# Patient Record
Sex: Female | Born: 1935 | Race: Black or African American | Hispanic: No | Smoking: Never smoker
Health system: Southern US, Community
[De-identification: ages and names within clinical notes are randomized; demographics above are authoritative.]

## PROBLEM LIST (undated history)

## (undated) DIAGNOSIS — M1990 Unspecified osteoarthritis, unspecified site: Secondary | ICD-10-CM

## (undated) DIAGNOSIS — I10 Essential (primary) hypertension: Secondary | ICD-10-CM

## (undated) DIAGNOSIS — E119 Type 2 diabetes mellitus without complications: Secondary | ICD-10-CM

## (undated) DIAGNOSIS — M51379 Other intervertebral disc degeneration, lumbosacral region without mention of lumbar back pain or lower extremity pain: Secondary | ICD-10-CM

## (undated) DIAGNOSIS — M5137 Other intervertebral disc degeneration, lumbosacral region: Secondary | ICD-10-CM

## (undated) HISTORY — PX: APPENDECTOMY: SHX54

## (undated) HISTORY — DX: Other intervertebral disc degeneration, lumbosacral region without mention of lumbar back pain or lower extremity pain: M51.379

## (undated) HISTORY — PX: HYSTEROTOMY: SHX1776

## (undated) HISTORY — DX: Other intervertebral disc degeneration, lumbosacral region: M51.37

## (undated) HISTORY — DX: Unspecified osteoarthritis, unspecified site: M19.90

---

## 2015-06-21 DIAGNOSIS — N1330 Unspecified hydronephrosis: Secondary | ICD-10-CM | POA: Diagnosis not present

## 2015-06-21 DIAGNOSIS — F329 Major depressive disorder, single episode, unspecified: Secondary | ICD-10-CM | POA: Diagnosis present

## 2015-06-21 DIAGNOSIS — E871 Hypo-osmolality and hyponatremia: Secondary | ICD-10-CM | POA: Diagnosis not present

## 2015-06-21 DIAGNOSIS — E119 Type 2 diabetes mellitus without complications: Secondary | ICD-10-CM | POA: Diagnosis present

## 2015-06-21 DIAGNOSIS — N132 Hydronephrosis with renal and ureteral calculous obstruction: Secondary | ICD-10-CM | POA: Diagnosis present

## 2015-06-21 DIAGNOSIS — I10 Essential (primary) hypertension: Secondary | ICD-10-CM | POA: Diagnosis not present

## 2015-06-21 DIAGNOSIS — M25551 Pain in right hip: Secondary | ICD-10-CM | POA: Diagnosis not present

## 2015-06-21 DIAGNOSIS — R1011 Right upper quadrant pain: Secondary | ICD-10-CM | POA: Diagnosis not present

## 2015-06-21 DIAGNOSIS — E860 Dehydration: Secondary | ICD-10-CM | POA: Diagnosis present

## 2015-07-01 DIAGNOSIS — I10 Essential (primary) hypertension: Secondary | ICD-10-CM | POA: Diagnosis not present

## 2015-07-01 DIAGNOSIS — Z6827 Body mass index (BMI) 27.0-27.9, adult: Secondary | ICD-10-CM | POA: Diagnosis not present

## 2015-07-01 DIAGNOSIS — E119 Type 2 diabetes mellitus without complications: Secondary | ICD-10-CM | POA: Diagnosis not present

## 2015-07-01 DIAGNOSIS — E871 Hypo-osmolality and hyponatremia: Secondary | ICD-10-CM | POA: Diagnosis not present

## 2015-07-04 DIAGNOSIS — R079 Chest pain, unspecified: Secondary | ICD-10-CM | POA: Diagnosis not present

## 2015-07-04 DIAGNOSIS — R0789 Other chest pain: Secondary | ICD-10-CM | POA: Diagnosis not present

## 2015-07-04 DIAGNOSIS — R0981 Nasal congestion: Secondary | ICD-10-CM | POA: Diagnosis not present

## 2015-07-04 DIAGNOSIS — E119 Type 2 diabetes mellitus without complications: Secondary | ICD-10-CM | POA: Diagnosis not present

## 2015-07-04 DIAGNOSIS — I10 Essential (primary) hypertension: Secondary | ICD-10-CM | POA: Diagnosis not present

## 2015-08-02 DIAGNOSIS — I10 Essential (primary) hypertension: Secondary | ICD-10-CM | POA: Diagnosis not present

## 2015-08-02 DIAGNOSIS — R4189 Other symptoms and signs involving cognitive functions and awareness: Secondary | ICD-10-CM | POA: Diagnosis not present

## 2015-08-02 DIAGNOSIS — M150 Primary generalized (osteo)arthritis: Secondary | ICD-10-CM | POA: Diagnosis not present

## 2015-08-02 DIAGNOSIS — E878 Other disorders of electrolyte and fluid balance, not elsewhere classified: Secondary | ICD-10-CM | POA: Diagnosis not present

## 2015-08-02 DIAGNOSIS — E1151 Type 2 diabetes mellitus with diabetic peripheral angiopathy without gangrene: Secondary | ICD-10-CM | POA: Diagnosis not present

## 2015-08-12 DIAGNOSIS — L858 Other specified epidermal thickening: Secondary | ICD-10-CM | POA: Diagnosis not present

## 2015-08-12 DIAGNOSIS — M79674 Pain in right toe(s): Secondary | ICD-10-CM | POA: Diagnosis not present

## 2015-08-12 DIAGNOSIS — M2041 Other hammer toe(s) (acquired), right foot: Secondary | ICD-10-CM | POA: Diagnosis not present

## 2015-08-29 DIAGNOSIS — E1151 Type 2 diabetes mellitus with diabetic peripheral angiopathy without gangrene: Secondary | ICD-10-CM | POA: Diagnosis not present

## 2015-08-29 DIAGNOSIS — R4189 Other symptoms and signs involving cognitive functions and awareness: Secondary | ICD-10-CM | POA: Diagnosis not present

## 2015-08-29 DIAGNOSIS — H04123 Dry eye syndrome of bilateral lacrimal glands: Secondary | ICD-10-CM | POA: Diagnosis not present

## 2015-08-29 DIAGNOSIS — I10 Essential (primary) hypertension: Secondary | ICD-10-CM | POA: Diagnosis not present

## 2015-08-29 DIAGNOSIS — H26491 Other secondary cataract, right eye: Secondary | ICD-10-CM | POA: Diagnosis not present

## 2015-08-29 DIAGNOSIS — E878 Other disorders of electrolyte and fluid balance, not elsewhere classified: Secondary | ICD-10-CM | POA: Diagnosis not present

## 2015-08-29 DIAGNOSIS — M150 Primary generalized (osteo)arthritis: Secondary | ICD-10-CM | POA: Diagnosis not present

## 2015-09-26 DIAGNOSIS — M2041 Other hammer toe(s) (acquired), right foot: Secondary | ICD-10-CM | POA: Diagnosis not present

## 2015-09-26 DIAGNOSIS — M79674 Pain in right toe(s): Secondary | ICD-10-CM | POA: Diagnosis not present

## 2015-10-03 DIAGNOSIS — M2041 Other hammer toe(s) (acquired), right foot: Secondary | ICD-10-CM | POA: Diagnosis not present

## 2015-10-03 DIAGNOSIS — Z01818 Encounter for other preprocedural examination: Secondary | ICD-10-CM | POA: Diagnosis not present

## 2015-10-17 DIAGNOSIS — Z01818 Encounter for other preprocedural examination: Secondary | ICD-10-CM | POA: Diagnosis not present

## 2015-10-17 DIAGNOSIS — M2041 Other hammer toe(s) (acquired), right foot: Secondary | ICD-10-CM | POA: Diagnosis not present

## 2015-10-17 DIAGNOSIS — E878 Other disorders of electrolyte and fluid balance, not elsewhere classified: Secondary | ICD-10-CM | POA: Diagnosis not present

## 2015-10-17 DIAGNOSIS — E1151 Type 2 diabetes mellitus with diabetic peripheral angiopathy without gangrene: Secondary | ICD-10-CM | POA: Diagnosis not present

## 2015-10-17 DIAGNOSIS — R4189 Other symptoms and signs involving cognitive functions and awareness: Secondary | ICD-10-CM | POA: Diagnosis not present

## 2015-10-17 DIAGNOSIS — I10 Essential (primary) hypertension: Secondary | ICD-10-CM | POA: Diagnosis not present

## 2015-10-28 DIAGNOSIS — M2041 Other hammer toe(s) (acquired), right foot: Secondary | ICD-10-CM | POA: Diagnosis not present

## 2015-10-28 DIAGNOSIS — Z79899 Other long term (current) drug therapy: Secondary | ICD-10-CM | POA: Diagnosis not present

## 2015-10-28 DIAGNOSIS — Z7984 Long term (current) use of oral hypoglycemic drugs: Secondary | ICD-10-CM | POA: Diagnosis not present

## 2015-10-28 DIAGNOSIS — Z90710 Acquired absence of both cervix and uterus: Secondary | ICD-10-CM | POA: Diagnosis not present

## 2015-10-28 DIAGNOSIS — Z791 Long term (current) use of non-steroidal anti-inflammatories (NSAID): Secondary | ICD-10-CM | POA: Diagnosis not present

## 2015-10-28 DIAGNOSIS — E119 Type 2 diabetes mellitus without complications: Secondary | ICD-10-CM | POA: Diagnosis not present

## 2015-10-28 DIAGNOSIS — I10 Essential (primary) hypertension: Secondary | ICD-10-CM | POA: Diagnosis not present

## 2015-11-02 DIAGNOSIS — M2041 Other hammer toe(s) (acquired), right foot: Secondary | ICD-10-CM | POA: Diagnosis not present

## 2015-11-09 DIAGNOSIS — M2041 Other hammer toe(s) (acquired), right foot: Secondary | ICD-10-CM | POA: Diagnosis not present

## 2015-11-09 DIAGNOSIS — M76821 Posterior tibial tendinitis, right leg: Secondary | ICD-10-CM | POA: Diagnosis not present

## 2015-12-19 DIAGNOSIS — I10 Essential (primary) hypertension: Secondary | ICD-10-CM | POA: Diagnosis not present

## 2015-12-19 DIAGNOSIS — E1151 Type 2 diabetes mellitus with diabetic peripheral angiopathy without gangrene: Secondary | ICD-10-CM | POA: Diagnosis not present

## 2015-12-19 DIAGNOSIS — R4189 Other symptoms and signs involving cognitive functions and awareness: Secondary | ICD-10-CM | POA: Diagnosis not present

## 2015-12-19 DIAGNOSIS — M25571 Pain in right ankle and joints of right foot: Secondary | ICD-10-CM | POA: Diagnosis not present

## 2015-12-19 DIAGNOSIS — E878 Other disorders of electrolyte and fluid balance, not elsewhere classified: Secondary | ICD-10-CM | POA: Diagnosis not present

## 2015-12-19 DIAGNOSIS — M150 Primary generalized (osteo)arthritis: Secondary | ICD-10-CM | POA: Diagnosis not present

## 2016-01-02 DIAGNOSIS — M79671 Pain in right foot: Secondary | ICD-10-CM | POA: Diagnosis not present

## 2016-01-02 DIAGNOSIS — M65871 Other synovitis and tenosynovitis, right ankle and foot: Secondary | ICD-10-CM | POA: Diagnosis not present

## 2016-01-02 DIAGNOSIS — M76821 Posterior tibial tendinitis, right leg: Secondary | ICD-10-CM | POA: Diagnosis not present

## 2016-01-30 DIAGNOSIS — M7661 Achilles tendinitis, right leg: Secondary | ICD-10-CM | POA: Diagnosis not present

## 2016-02-01 DIAGNOSIS — M76821 Posterior tibial tendinitis, right leg: Secondary | ICD-10-CM | POA: Diagnosis not present

## 2016-02-25 DIAGNOSIS — I10 Essential (primary) hypertension: Secondary | ICD-10-CM | POA: Diagnosis not present

## 2016-02-25 DIAGNOSIS — E119 Type 2 diabetes mellitus without complications: Secondary | ICD-10-CM | POA: Diagnosis not present

## 2016-02-25 DIAGNOSIS — R42 Dizziness and giddiness: Secondary | ICD-10-CM | POA: Diagnosis not present

## 2016-02-25 DIAGNOSIS — Z7984 Long term (current) use of oral hypoglycemic drugs: Secondary | ICD-10-CM | POA: Diagnosis not present

## 2016-02-25 DIAGNOSIS — X58XXXA Exposure to other specified factors, initial encounter: Secondary | ICD-10-CM | POA: Diagnosis not present

## 2016-02-25 DIAGNOSIS — Y929 Unspecified place or not applicable: Secondary | ICD-10-CM | POA: Diagnosis not present

## 2016-02-25 DIAGNOSIS — S29011A Strain of muscle and tendon of front wall of thorax, initial encounter: Secondary | ICD-10-CM | POA: Diagnosis not present

## 2016-02-25 DIAGNOSIS — Y93K1 Activity, walking an animal: Secondary | ICD-10-CM | POA: Diagnosis not present

## 2016-03-24 DIAGNOSIS — R4182 Altered mental status, unspecified: Secondary | ICD-10-CM | POA: Diagnosis not present

## 2016-03-24 DIAGNOSIS — Z79899 Other long term (current) drug therapy: Secondary | ICD-10-CM | POA: Diagnosis not present

## 2016-03-24 DIAGNOSIS — G9341 Metabolic encephalopathy: Secondary | ICD-10-CM | POA: Diagnosis not present

## 2016-03-24 DIAGNOSIS — N390 Urinary tract infection, site not specified: Secondary | ICD-10-CM | POA: Diagnosis not present

## 2016-03-24 DIAGNOSIS — Z23 Encounter for immunization: Secondary | ICD-10-CM | POA: Diagnosis not present

## 2016-03-24 DIAGNOSIS — R079 Chest pain, unspecified: Secondary | ICD-10-CM | POA: Diagnosis not present

## 2016-03-24 DIAGNOSIS — I10 Essential (primary) hypertension: Secondary | ICD-10-CM | POA: Diagnosis not present

## 2016-03-24 DIAGNOSIS — N281 Cyst of kidney, acquired: Secondary | ICD-10-CM | POA: Diagnosis not present

## 2016-03-24 DIAGNOSIS — B961 Klebsiella pneumoniae [K. pneumoniae] as the cause of diseases classified elsewhere: Secondary | ICD-10-CM | POA: Diagnosis not present

## 2016-03-24 DIAGNOSIS — E861 Hypovolemia: Secondary | ICD-10-CM | POA: Diagnosis not present

## 2016-03-24 DIAGNOSIS — N3000 Acute cystitis without hematuria: Secondary | ICD-10-CM | POA: Diagnosis not present

## 2016-03-24 DIAGNOSIS — R1110 Vomiting, unspecified: Secondary | ICD-10-CM | POA: Diagnosis not present

## 2016-03-24 DIAGNOSIS — Z7984 Long term (current) use of oral hypoglycemic drugs: Secondary | ICD-10-CM | POA: Diagnosis not present

## 2016-03-24 DIAGNOSIS — R911 Solitary pulmonary nodule: Secondary | ICD-10-CM | POA: Diagnosis not present

## 2016-03-24 DIAGNOSIS — I214 Non-ST elevation (NSTEMI) myocardial infarction: Secondary | ICD-10-CM | POA: Diagnosis not present

## 2016-03-24 DIAGNOSIS — E871 Hypo-osmolality and hyponatremia: Secondary | ICD-10-CM | POA: Diagnosis not present

## 2016-03-24 DIAGNOSIS — R112 Nausea with vomiting, unspecified: Secondary | ICD-10-CM | POA: Diagnosis not present

## 2016-03-24 DIAGNOSIS — E119 Type 2 diabetes mellitus without complications: Secondary | ICD-10-CM | POA: Diagnosis not present

## 2016-03-24 DIAGNOSIS — R631 Polydipsia: Secondary | ICD-10-CM | POA: Diagnosis present

## 2016-03-24 LAB — CBC AND DIFFERENTIAL
HCT: 38 (ref 36–46)
HEMOGLOBIN: 13.8 (ref 12.0–16.0)
PLATELETS: 199 (ref 150–399)
WBC: 4.7

## 2016-03-24 LAB — PROTIME-INR: PROTIME: 13.7 (ref 10.0–13.8)

## 2016-03-24 LAB — BASIC METABOLIC PANEL
BUN: 10 (ref 4–21)
CREATININE: 1 (ref 0.5–1.1)
Glucose: 88
Potassium: 4.3 (ref 3.4–5.3)
SODIUM: 114 — AB (ref 137–147)

## 2016-03-24 LAB — POCT INR: INR: 1 (ref 0.9–1.1)

## 2016-03-24 LAB — TSH: TSH: 0.78 (ref 0.41–5.90)

## 2016-03-24 LAB — HEPATIC FUNCTION PANEL
ALK PHOS: 61 (ref 25–125)
ALT: 39 — AB (ref 7–35)
AST: 32 (ref 13–35)

## 2016-03-26 LAB — BASIC METABOLIC PANEL
BUN: 9 (ref 4–21)
Creatinine: 1 (ref 0.5–1.1)
GLUCOSE: 115
POTASSIUM: 4.4 (ref 3.4–5.3)
Sodium: 131 — AB (ref 137–147)

## 2016-03-26 LAB — CBC AND DIFFERENTIAL
HEMATOCRIT: 33 — AB (ref 36–46)
Hemoglobin: 11.8 — AB (ref 12.0–16.0)
PLATELETS: 222 (ref 150–399)
WBC: 5.1

## 2016-03-27 LAB — BASIC METABOLIC PANEL
BUN: 10 (ref 4–21)
CREATININE: 1.1 (ref 0.5–1.1)
GLUCOSE: 103
POTASSIUM: 5 (ref 3.4–5.3)
Sodium: 132 — AB (ref 137–147)

## 2016-03-29 DIAGNOSIS — E119 Type 2 diabetes mellitus without complications: Secondary | ICD-10-CM | POA: Diagnosis not present

## 2016-03-29 DIAGNOSIS — F0390 Unspecified dementia without behavioral disturbance: Secondary | ICD-10-CM | POA: Diagnosis not present

## 2016-03-29 DIAGNOSIS — E871 Hypo-osmolality and hyponatremia: Secondary | ICD-10-CM | POA: Diagnosis not present

## 2016-03-29 DIAGNOSIS — N390 Urinary tract infection, site not specified: Secondary | ICD-10-CM | POA: Diagnosis not present

## 2016-03-29 DIAGNOSIS — I10 Essential (primary) hypertension: Secondary | ICD-10-CM | POA: Diagnosis not present

## 2016-04-02 DIAGNOSIS — E119 Type 2 diabetes mellitus without complications: Secondary | ICD-10-CM | POA: Diagnosis not present

## 2016-04-02 DIAGNOSIS — I10 Essential (primary) hypertension: Secondary | ICD-10-CM | POA: Diagnosis not present

## 2016-04-02 DIAGNOSIS — F0390 Unspecified dementia without behavioral disturbance: Secondary | ICD-10-CM | POA: Diagnosis not present

## 2016-04-02 DIAGNOSIS — E871 Hypo-osmolality and hyponatremia: Secondary | ICD-10-CM | POA: Diagnosis not present

## 2016-04-02 DIAGNOSIS — N390 Urinary tract infection, site not specified: Secondary | ICD-10-CM | POA: Diagnosis not present

## 2016-04-04 DIAGNOSIS — I10 Essential (primary) hypertension: Secondary | ICD-10-CM | POA: Diagnosis not present

## 2016-04-04 DIAGNOSIS — E871 Hypo-osmolality and hyponatremia: Secondary | ICD-10-CM | POA: Diagnosis not present

## 2016-04-04 DIAGNOSIS — F0390 Unspecified dementia without behavioral disturbance: Secondary | ICD-10-CM | POA: Diagnosis not present

## 2016-04-04 DIAGNOSIS — N390 Urinary tract infection, site not specified: Secondary | ICD-10-CM | POA: Diagnosis not present

## 2016-04-04 DIAGNOSIS — E119 Type 2 diabetes mellitus without complications: Secondary | ICD-10-CM | POA: Diagnosis not present

## 2016-04-06 DIAGNOSIS — E119 Type 2 diabetes mellitus without complications: Secondary | ICD-10-CM | POA: Diagnosis not present

## 2016-04-06 DIAGNOSIS — N390 Urinary tract infection, site not specified: Secondary | ICD-10-CM | POA: Diagnosis not present

## 2016-04-06 DIAGNOSIS — E871 Hypo-osmolality and hyponatremia: Secondary | ICD-10-CM | POA: Diagnosis not present

## 2016-04-06 DIAGNOSIS — I10 Essential (primary) hypertension: Secondary | ICD-10-CM | POA: Diagnosis not present

## 2016-04-06 DIAGNOSIS — F0390 Unspecified dementia without behavioral disturbance: Secondary | ICD-10-CM | POA: Diagnosis not present

## 2016-04-19 DIAGNOSIS — E119 Type 2 diabetes mellitus without complications: Secondary | ICD-10-CM | POA: Diagnosis not present

## 2016-04-19 DIAGNOSIS — E871 Hypo-osmolality and hyponatremia: Secondary | ICD-10-CM | POA: Diagnosis not present

## 2016-04-19 DIAGNOSIS — N390 Urinary tract infection, site not specified: Secondary | ICD-10-CM | POA: Diagnosis not present

## 2016-04-19 DIAGNOSIS — I10 Essential (primary) hypertension: Secondary | ICD-10-CM | POA: Diagnosis not present

## 2016-04-19 DIAGNOSIS — F0390 Unspecified dementia without behavioral disturbance: Secondary | ICD-10-CM | POA: Diagnosis not present

## 2016-07-26 ENCOUNTER — Ambulatory Visit (HOSPITAL_COMMUNITY)
Admission: EM | Admit: 2016-07-26 | Discharge: 2016-07-26 | Disposition: A | Discharge status: Home / Self Care | Payer: Medicare Other | Attending: Emergency Medicine | Admitting: Emergency Medicine

## 2016-07-26 ENCOUNTER — Encounter (HOSPITAL_COMMUNITY): Payer: Self-pay | Admitting: Emergency Medicine

## 2016-07-26 DIAGNOSIS — Z760 Encounter for issue of repeat prescription: Secondary | ICD-10-CM

## 2016-07-26 DIAGNOSIS — M25511 Pain in right shoulder: Secondary | ICD-10-CM | POA: Diagnosis not present

## 2016-07-26 DIAGNOSIS — G8929 Other chronic pain: Secondary | ICD-10-CM | POA: Diagnosis not present

## 2016-07-26 HISTORY — DX: Type 2 diabetes mellitus without complications: E11.9

## 2016-07-26 HISTORY — DX: Essential (primary) hypertension: I10

## 2016-07-26 MED ORDER — GLIPIZIDE 5 MG PO TABS: 2.5000 mg | ORAL_TABLET | Freq: Every day | ORAL | 0 refills | Status: DC

## 2016-07-26 MED ORDER — LISINOPRIL-HYDROCHLOROTHIAZIDE 20-25 MG PO TABS: 1.0000 | ORAL_TABLET | Freq: Every day | ORAL | 0 refills | Status: DC

## 2016-07-26 MED ORDER — MELOXICAM 7.5 MG PO TABS: 7.5000 mg | ORAL_TABLET | Freq: Every day | ORAL | 0 refills | Status: DC

## 2016-07-26 MED ORDER — TRIAMCINOLONE ACETONIDE 40 MG/ML IJ SUSP
INTRAMUSCULAR | Status: AC
  Filled 2016-07-26: qty 1

## 2016-07-26 NOTE — ED Triage Notes (Signed)
Here for medication refill on DM and BP meds  Reports she ran out 1 week ago... Does no have PCP  Voices no other concerns.   A&O x4... NAD

## 2016-07-26 NOTE — ED Provider Notes (Signed)
CSN: [id number]     Arrival date & time 07/26/16  1321 History   First MD Initiated Contact with Patient 07/26/16 1506     Chief Complaint  Patient presents with  . Medication Refill   (Consider location/radiation/quality/duration/timing/severity/associated sxs/prior Treatment) 83 year old female presents with right shoulder pain for 1 months, unable to left her arm and comb her hair due to pain, has had previous injection of the joint and requests treatment. No fever, redness, swelling or systemic signs of symptoms of infection. She also requests refill of her medications. States she has moved to the area from [place] Has not established with a PCP here.   The history is provided by the patient.    Past Medical History:  Diagnosis Date  . Diabetes mellitus without complication (HCC)   . Hypertension    History reviewed. No pertinent surgical history. History reviewed. No pertinent family history. Social History  Substance Use Topics  . Smoking status: Never Smoker  . Smokeless tobacco: Never Used  . Alcohol use No   OB History    No data available     Review of Systems  Reason unable to perform ROS: as covered in HPI.  All other systems reviewed and are negative.   Allergies  Patient has no known allergies.  Home Medications   Prior to Admission medications   Medication Sig Start Date End Date Taking? Authorizing Provider  glipiZIDE (GLUCOTROL) 5 MG tablet Take 0.5 tablets (2.5 mg total) by mouth daily before breakfast. 07/26/16   Dorena BodoLawrence Babbette Dalesandro, NP  lisinopril-hydrochlorothiazide (PRINZIDE,ZESTORETIC) 20-25 MG tablet Take 1 tablet by mouth daily. 07/26/16   Dorena BodoLawrence Rasheka Denard, NP  meloxicam (MOBIC) 7.5 MG tablet Take 1 tablet (7.5 mg total) by mouth daily. 07/26/16   Dorena BodoLawrence Elfa Wooton, NP   Meds Ordered and Administered this Visit  Medications - No data to display  BP (!) 120/46 (BP Location: Right Arm)   Pulse 65   Temp 98 F (36.7 C) (Oral)   Resp 18   SpO2 100%  No  data found.   Physical Exam  Constitutional: She is oriented to person, place, and time. She appears well-developed and well-nourished. No distress.  HENT:  Head: Normocephalic and atraumatic.  Right Ear: External ear normal.  Left Ear: External ear normal.  Mouth/Throat: Oropharynx is clear and moist.  Cardiovascular: Normal rate, regular rhythm and normal heart sounds.   Pulmonary/Chest: Effort normal and breath sounds normal.  Abdominal: Soft. Bowel sounds are normal.  Musculoskeletal:       Right shoulder: She exhibits decreased range of motion, tenderness and pain. She exhibits no swelling, no effusion, no crepitus, no deformity and no laceration.  Right shoulder pain with internal rotation, external rotation, and flexion. Pain does not radiate.  Neurological: She is alert and oriented to person, place, and time.  Skin: Skin is warm and dry. Capillary refill takes less than 2 seconds. She is not diaphoretic.  Psychiatric: She has a normal mood and affect.    Urgent Care Course     .Joint Aspiration/Arthrocentesis Date/Time: 07/26/2016 3:32 PM Performed by: Dorena BodoKENNARD, Angelic Schnelle Authorized by: Domenick GongMORTENSON, ASHLEY   Consent:    Consent obtained:  Verbal   Consent given by:  Patient   Risks discussed:  Bleeding, infection, nerve damage and pain   Alternatives discussed:  No treatment, delayed treatment and alternative treatment Location:    Location:  Shoulder   Shoulder:  R glenohumeral Anesthesia (see MAR for exact dosages):    Anesthesia method:  Local infiltration   Local anesthetic:  Bupivacaine 0.5% w/o epi Procedure details:    Approach:  Anterior   Aspirate amount:  0   Aspirate characteristics:  Clear   Steroid injected: yes     Specimen collected: no   Post-procedure details:    Dressing:  Adhesive bandage   Patient tolerance of procedure:  Tolerated well, no immediate complications Comments:     Experienced immediate relief of symptoms    (including critical  care time)  Labs Review Labs Reviewed - No data to display  Imaging Review No results found.   Visual Acuity Review  Right Eye Distance:   Left Eye Distance:   Bilateral Distance:    Right Eye Near:   Left Eye Near:    Bilateral Near:         MDM   1. Chronic right shoulder pain   2. Medication refill     You have received a joint injection in your right shoulder today of Kenalog and Bupivacaine for pain. I have refilled your glipizide, lisinopril-HCTZ, and Mobic. I recommend you establish with a primary care provider who can follow up and manage you medications. Should your symptoms worsen follow up with a primary care provider or return to clinic.     Dorena Bodo, NP 07/26/16 1537

## 2016-07-26 NOTE — Discharge Instructions (Signed)
You have received a joint injection in your right shoulder today of Kenalog and Bupivacaine for pain. I have refilled your glipizide, lisinopril-HCTZ, and Mobic. I recommend you establish with a primary care provider who can follow up and manage you medications. Should your symptoms worsen follow up with a primary care provider or return to clinic.

## 2016-09-07 DIAGNOSIS — I10 Essential (primary) hypertension: Secondary | ICD-10-CM | POA: Diagnosis not present

## 2016-09-07 DIAGNOSIS — E119 Type 2 diabetes mellitus without complications: Secondary | ICD-10-CM | POA: Diagnosis not present

## 2016-09-07 DIAGNOSIS — M2550 Pain in unspecified joint: Secondary | ICD-10-CM | POA: Diagnosis not present

## 2016-10-26 DIAGNOSIS — M2550 Pain in unspecified joint: Secondary | ICD-10-CM | POA: Diagnosis not present

## 2016-10-26 DIAGNOSIS — E119 Type 2 diabetes mellitus without complications: Secondary | ICD-10-CM | POA: Diagnosis not present

## 2016-10-26 DIAGNOSIS — I10 Essential (primary) hypertension: Secondary | ICD-10-CM | POA: Diagnosis not present

## 2016-10-26 DIAGNOSIS — Z0000 Encounter for general adult medical examination without abnormal findings: Secondary | ICD-10-CM | POA: Diagnosis not present

## 2016-10-26 LAB — BASIC METABOLIC PANEL
BUN: 31 — AB (ref 4–21)
CREATININE: 1.3 — AB (ref 0.5–1.1)
Glucose: 106
Potassium: 4.5 (ref 3.4–5.3)
Sodium: 142 (ref 137–147)

## 2016-10-26 LAB — HEPATIC FUNCTION PANEL
ALK PHOS: 65 (ref 25–125)
ALT: 27 (ref 7–35)
AST: 26 (ref 13–35)
Bilirubin, Total: 0.7

## 2016-10-26 LAB — LIPID PANEL
CHOLESTEROL: 181 (ref 0–200)
HDL: 76 — AB (ref 35–70)
LDL Cholesterol: 85
TRIGLYCERIDES: 100 (ref 40–160)

## 2016-10-26 LAB — HEMOGLOBIN A1C: HEMOGLOBIN A1C: 6.3

## 2016-11-23 ENCOUNTER — Inpatient Hospital Stay (HOSPITAL_COMMUNITY): Payer: Medicare Other

## 2016-11-23 ENCOUNTER — Encounter (HOSPITAL_COMMUNITY): Payer: Self-pay | Admitting: Emergency Medicine

## 2016-11-23 ENCOUNTER — Emergency Department (HOSPITAL_COMMUNITY): Payer: Medicare Other

## 2016-11-23 ENCOUNTER — Inpatient Hospital Stay (HOSPITAL_COMMUNITY)
Admission: EM | Admit: 2016-11-23 | Discharge: 2016-11-28 | DRG: 640 | Disposition: A | Discharge status: Home Health | Payer: Medicare Other | Attending: Internal Medicine | Admitting: Internal Medicine

## 2016-11-23 DIAGNOSIS — I10 Essential (primary) hypertension: Secondary | ICD-10-CM | POA: Diagnosis not present

## 2016-11-23 DIAGNOSIS — E119 Type 2 diabetes mellitus without complications: Secondary | ICD-10-CM | POA: Diagnosis present

## 2016-11-23 DIAGNOSIS — E871 Hypo-osmolality and hyponatremia: Secondary | ICD-10-CM | POA: Diagnosis present

## 2016-11-23 DIAGNOSIS — E861 Hypovolemia: Secondary | ICD-10-CM | POA: Diagnosis present

## 2016-11-23 DIAGNOSIS — Y92009 Unspecified place in unspecified non-institutional (private) residence as the place of occurrence of the external cause: Secondary | ICD-10-CM | POA: Diagnosis not present

## 2016-11-23 DIAGNOSIS — T502X5A Adverse effect of carbonic-anhydrase inhibitors, benzothiadiazides and other diuretics, initial encounter: Secondary | ICD-10-CM | POA: Diagnosis present

## 2016-11-23 DIAGNOSIS — D72819 Decreased white blood cell count, unspecified: Secondary | ICD-10-CM | POA: Diagnosis present

## 2016-11-23 DIAGNOSIS — E876 Hypokalemia: Secondary | ICD-10-CM | POA: Diagnosis not present

## 2016-11-23 DIAGNOSIS — Z7984 Long term (current) use of oral hypoglycemic drugs: Secondary | ICD-10-CM

## 2016-11-23 DIAGNOSIS — E118 Type 2 diabetes mellitus with unspecified complications: Secondary | ICD-10-CM | POA: Diagnosis not present

## 2016-11-23 DIAGNOSIS — H9190 Unspecified hearing loss, unspecified ear: Secondary | ICD-10-CM | POA: Diagnosis present

## 2016-11-23 DIAGNOSIS — E860 Dehydration: Secondary | ICD-10-CM | POA: Diagnosis present

## 2016-11-23 DIAGNOSIS — R4182 Altered mental status, unspecified: Secondary | ICD-10-CM | POA: Diagnosis not present

## 2016-11-23 DIAGNOSIS — R296 Repeated falls: Secondary | ICD-10-CM | POA: Diagnosis present

## 2016-11-23 DIAGNOSIS — Z79899 Other long term (current) drug therapy: Secondary | ICD-10-CM

## 2016-11-23 DIAGNOSIS — K5900 Constipation, unspecified: Secondary | ICD-10-CM | POA: Diagnosis present

## 2016-11-23 DIAGNOSIS — R531 Weakness: Secondary | ICD-10-CM | POA: Diagnosis not present

## 2016-11-23 DIAGNOSIS — G9341 Metabolic encephalopathy: Secondary | ICD-10-CM | POA: Diagnosis present

## 2016-11-23 DIAGNOSIS — I639 Cerebral infarction, unspecified: Secondary | ICD-10-CM

## 2016-11-23 LAB — COMPREHENSIVE METABOLIC PANEL
ALBUMIN: 3.9 g/dL (ref 3.5–5.0)
ALK PHOS: 49 U/L (ref 38–126)
ALT: 21 U/L (ref 14–54)
AST: 33 U/L (ref 15–41)
Anion gap: 9 (ref 5–15)
BILIRUBIN TOTAL: 1.5 mg/dL — AB (ref 0.3–1.2)
BUN: 7 mg/dL (ref 6–20)
CALCIUM: 9 mg/dL (ref 8.9–10.3)
CO2: 23 mmol/L (ref 22–32)
CREATININE: 0.91 mg/dL (ref 0.44–1.00)
Chloride: 77 mmol/L — ABNORMAL LOW (ref 101–111)
GFR calc non Af Amer: 55 mL/min — ABNORMAL LOW (ref >60)
GLUCOSE: 134 mg/dL — AB (ref 65–99)
Potassium: 3.2 mmol/L — ABNORMAL LOW (ref 3.5–5.1)
SODIUM: 109 mmol/L — AB (ref 135–145)
Total Protein: 6.5 g/dL (ref 6.5–8.1)

## 2016-11-23 LAB — BASIC METABOLIC PANEL
Anion gap: 11 (ref 5–15)
Anion gap: 8 (ref 5–15)
BUN: 7 mg/dL (ref 6–20)
BUN: 6 mg/dL (ref 6–20)
CALCIUM: 9 mg/dL (ref 8.9–10.3)
CALCIUM: 9.3 mg/dL (ref 8.9–10.3)
CHLORIDE: 83 mmol/L — AB (ref 101–111)
CO2: 23 mmol/L (ref 22–32)
CO2: 27 mmol/L (ref 22–32)
CREATININE: 0.88 mg/dL (ref 0.44–1.00)
Chloride: 80 mmol/L — ABNORMAL LOW (ref 101–111)
Creatinine, Ser: 0.86 mg/dL (ref 0.44–1.00)
GFR calc non Af Amer: 59 mL/min — ABNORMAL LOW (ref >60)
GFR calc non Af Amer: 57 mL/min — ABNORMAL LOW (ref >60)
Glucose, Bld: 97 mg/dL (ref 65–99)
Glucose, Bld: 86 mg/dL (ref 65–99)
POTASSIUM: 3.3 mmol/L — AB (ref 3.5–5.1)
Potassium: 3.2 mmol/L — ABNORMAL LOW (ref 3.5–5.1)
SODIUM: 114 mmol/L — AB (ref 135–145)
SODIUM: 118 mmol/L — AB (ref 135–145)

## 2016-11-23 LAB — URINALYSIS, ROUTINE W REFLEX MICROSCOPIC
Bilirubin Urine: NEGATIVE
GLUCOSE, UA: NEGATIVE mg/dL
HGB URINE DIPSTICK: NEGATIVE
Ketones, ur: NEGATIVE mg/dL
Nitrite: NEGATIVE
PH: 7 (ref 5.0–8.0)
Protein, ur: NEGATIVE mg/dL
SPECIFIC GRAVITY, URINE: 1.001 — AB (ref 1.005–1.030)

## 2016-11-23 LAB — OSMOLALITY, URINE: Osmolality, Ur: 59 mOsm/kg — ABNORMAL LOW (ref 300–900)

## 2016-11-23 LAB — CBC
HCT: 34.6 % — ABNORMAL LOW (ref 36.0–46.0)
Hemoglobin: 12.6 g/dL (ref 12.0–15.0)
MCH: 28.1 pg (ref 26.0–34.0)
MCHC: 36.4 g/dL — AB (ref 30.0–36.0)
MCV: 77.2 fL — ABNORMAL LOW (ref 78.0–100.0)
PLATELETS: 236 10*3/uL (ref 150–400)
RBC: 4.48 MIL/uL (ref 3.87–5.11)
RDW: 12.5 % (ref 11.5–15.5)
WBC: 2.6 10*3/uL — ABNORMAL LOW (ref 4.0–10.5)

## 2016-11-23 LAB — OSMOLALITY: OSMOLALITY: 239 mosm/kg — AB (ref 275–295)

## 2016-11-23 LAB — POCT I-STAT TROPONIN I: TROPONIN I, POC: 0 ng/mL (ref 0.00–0.08)

## 2016-11-23 LAB — TSH: TSH: 1.033 u[IU]/mL (ref 0.350–4.500)

## 2016-11-23 LAB — SODIUM, URINE, RANDOM: Sodium, Ur: <10 mmol/L

## 2016-11-23 LAB — LIPASE, BLOOD: Lipase: 41 U/L (ref 11–51)

## 2016-11-23 LAB — CBG MONITORING, ED: Glucose-Capillary: 149 mg/dL — ABNORMAL HIGH (ref 65–99)

## 2016-11-23 LAB — GLUCOSE, CAPILLARY: GLUCOSE-CAPILLARY: 71 mg/dL (ref 65–99)

## 2016-11-23 MED ORDER — POTASSIUM CHLORIDE CRYS ER 20 MEQ PO TBCR
40.0000 meq | EXTENDED_RELEASE_TABLET | Freq: Once | ORAL | Status: AC
  Administered 2016-11-23: 40 meq via ORAL
  Filled 2016-11-23: qty 2

## 2016-11-23 MED ORDER — ONDANSETRON HCL 4 MG PO TABS: 4.0000 mg | ORAL_TABLET | Freq: Four times a day (QID) | ORAL | Status: DC | PRN

## 2016-11-23 MED ORDER — SODIUM CHLORIDE 0.9 % IV SOLN
INTRAVENOUS | Status: DC
  Administered 2016-11-23 – 2016-11-26 (×3): via INTRAVENOUS

## 2016-11-23 MED ORDER — ACETAMINOPHEN 650 MG RE SUPP: 650.0000 mg | Freq: Four times a day (QID) | RECTAL | Status: DC | PRN

## 2016-11-23 MED ORDER — ONDANSETRON HCL 4 MG/2ML IJ SOLN: 4.0000 mg | Freq: Four times a day (QID) | INTRAMUSCULAR | Status: DC | PRN

## 2016-11-23 MED ORDER — ACETAMINOPHEN 325 MG PO TABS
650.0000 mg | ORAL_TABLET | Freq: Four times a day (QID) | ORAL | Status: DC | PRN
  Administered 2016-11-24: 650 mg via ORAL
  Filled 2016-11-23: qty 2

## 2016-11-23 MED ORDER — SODIUM CHLORIDE 0.9 % IV SOLN
INTRAVENOUS | Status: DC
  Administered 2016-11-23: 21:00:00 via INTRAVENOUS

## 2016-11-23 MED ORDER — INSULIN ASPART 100 UNIT/ML ~~LOC~~ SOLN: 0.0000 [IU] | Freq: Three times a day (TID) | SUBCUTANEOUS | Status: DC

## 2016-11-23 MED ORDER — INSULIN ASPART 100 UNIT/ML ~~LOC~~ SOLN
0.0000 [IU] | Freq: Every day | SUBCUTANEOUS | Status: DC
Start: 2016-11-23 — End: 2016-11-25

## 2016-11-23 MED ORDER — LORAZEPAM 2 MG/ML IJ SOLN: 1.0000 mg | INTRAMUSCULAR | Status: DC | PRN

## 2016-11-23 MED ORDER — SODIUM CHLORIDE 3 % IV SOLN
INTRAVENOUS | Status: DC
  Administered 2016-11-23: 50 mL/h via INTRAVENOUS
  Filled 2016-11-23 (×3): qty 500

## 2016-11-23 MED ORDER — ENOXAPARIN SODIUM 40 MG/0.4ML ~~LOC~~ SOLN
40.0000 mg | SUBCUTANEOUS | Status: DC
  Administered 2016-11-23 – 2016-11-24 (×2): 40 mg via SUBCUTANEOUS
  Filled 2016-11-23 (×2): qty 0.4

## 2016-11-23 NOTE — Consult Note (Signed)
PULMONARY / CRITICAL CARE MEDICINE   Name: Monica Cohen MRN: [id number] DOB: [birth year]    ADMISSION DATE:  11/23/2016 CONSULTATION DATE:  11/23/2016  REFERRING MD:  Dr. Isidoro Donning  CHIEF COMPLAINT:  Hyponatremia/ Confusion  HISTORY OF PRESENT ILLNESS:  Patient is encephalopathic and therefore HPI obtained from medical chart review.  No family/ friends in room.  83 year old female with PMH of HTN, hard of hearing, and DM type 2 who presented to the ER for complaints confusion.  Patient lives alone independently and apparently lives a very active lifestyle, walking her dogs daily.  She recently moved to [place] area over the last several months.  She is a very poor historian and hard of hearing (wearing a right hearing aid).  She reports she takes her blood pressure and diabetes medications regularly but is not able to recall what they are.  Reportedly, her neighbor had not seen her over the last week and went to check on her.  She was found confused, weak, and holding on to things, so they brought her to the ER.  Patients reports some recent vomiting over the last week or so and questionable falls at home.   ED workup notable for Na 109, K 3.2, WBC 2.6, Hgb 12.6, glucose 134, UA negative, LFTs normal, urine osmolarity 59, urine sodium < 10, heart rate 82, blood pressure 119/53, resp rate 19, 99% on room air and afebrile at 97.7.  Patient was started on hypertonic saline in the ER.  TRH to admit patient, however given that patient is on 3% saline and requires ICU admission, PCCM consulted.     Of noted, on review of medical chart in care everywhere, patient had routine office visit on 10/26/2016 at [facility] with Na 142.  At that time she was taking mobic, lisinopril- HCTZ 20/12.5mg  daily, and glipizide 2.5mg  daily.   PAST MEDICAL HISTORY :  She  has a past medical history of Diabetes mellitus without complication (HCC) and Hypertension.  PAST SURGICAL HISTORY: She  has no past surgical history  on file.  No Known Allergies  No current facility-administered medications on file prior to encounter.    Current Outpatient Prescriptions on File Prior to Encounter  Medication Sig  . glipiZIDE (GLUCOTROL) 5 MG tablet Take 0.5 tablets (2.5 mg total) by mouth daily before breakfast.  . lisinopril-hydrochlorothiazide (PRINZIDE,ZESTORETIC) 20-25 MG tablet Take 1 tablet by mouth daily.  . meloxicam (MOBIC) 7.5 MG tablet Take 1 tablet (7.5 mg total) by mouth daily. (Patient not taking: Reported on 11/23/2016)    FAMILY HISTORY:  Her has no family status information on file.    SOCIAL HISTORY: She  reports that she has never smoked. She has never used smokeless tobacco. She reports that she does not drink alcohol.  REVIEW OF SYSTEMS:  Unable to assess  SUBJECTIVE:  Patient denies any SOB, pain, thirst.    VITAL SIGNS: BP (!) 118/91   Pulse 84   Temp 98.5 F (36.9 C) (Rectal)   Resp 15   SpO2 100%   HEMODYNAMICS:    VENTILATOR SETTINGS:    INTAKE / OUTPUT: No intake/output data recorded.  PHYSICAL EXAMINATION: General:  Elderly female lying in bed in NAD  HEENT: MM pink/moist, PERRL, no JVD Neuro: Alert to person, place, month but also confused at other times, MAE, generalized weakness  CV: s1s2 rrr, no m/r/g PULM: even/non-labored, lungs bilaterally clear  GI: soft, non-tender, bsx4 active  Extremities: warm/dry, no edema  Skin: no rashes, lesions,  or bruising noted   LABS:  BMET  Recent Labs Lab 11/23/16 1359  NA 109*  K 3.2*  CL 77*  CO2 23  BUN 7  CREATININE 0.91  GLUCOSE 134*    Electrolytes  Recent Labs Lab 11/23/16 1359  CALCIUM 9.0    CBC  Recent Labs Lab 11/23/16 1359  WBC 2.6*  HGB 12.6  HCT 34.6*  PLT 236    Coag's No results for input(s): APTT, INR in the last 168 hours.  Sepsis Markers No results for input(s): LATICACIDVEN, PROCALCITON, O2SATVEN in the last 168 hours.  ABG No results for input(s): PHART, PCO2ART,  PO2ART in the last 168 hours.  Liver Enzymes  Recent Labs Lab 11/23/16 1359  AST 33  ALT 21  ALKPHOS 49  BILITOT 1.5*  ALBUMIN 3.9    Cardiac Enzymes No results for input(s): TROPONINI, PROBNP in the last 168 hours.  Glucose  Recent Labs Lab 11/23/16 1401  GLUCAP 149*    Imaging Ct Head Wo Contrast  Result Date: 11/23/2016 CLINICAL DATA:  Increased weakness for 5 months with multiple falls EXAM: CT HEAD WITHOUT CONTRAST TECHNIQUE: Contiguous axial images were obtained from the base of the skull through the vertex without intravenous contrast. COMPARISON:  None. FINDINGS: Brain: No acute territorial infarction, hemorrhage or intracranial mass is seen. Mild periventricular white matter hypodensity likely small vessel ischemic change. Moderate atrophy. Normal ventricle size. Empty sella. Vascular: No hyperdense vessels.  Carotid artery calcifications. Skull: Normal. Negative for fracture or focal lesion. Sinuses/Orbits: Mild mucosal thickening in the ethmoid sinuses. No acute orbital abnormality. Bilateral lens extraction. Other: None IMPRESSION: 1. No CT evidence for acute intracranial abnormality. 2. Small vessel ischemic changes of the white matter and atrophy. Electronically Signed   By: Jasmine PangKim  Fujinaga M.D.   On: 11/23/2016 18:08   Dg Chest Port 1 View  Result Date: 11/23/2016 CLINICAL DATA:  83 year old female with weakness, back pain and hypertension EXAM: PORTABLE CHEST 1 VIEW COMPARISON:  None. FINDINGS: Cardiac and mediastinal contours are within normal limits. Atherosclerotic calcifications are present in the transverse aorta. No focal airspace consolidation, pleural effusion, pneumothorax or pulmonary edema. Calcifications project over the mitral annulus. The osseous structures are intact and unremarkable. IMPRESSION: 1. No acute cardiopulmonary process. 2.  Aortic Atherosclerosis (ICD10-170.0) 3. Calcifications of the mitral valve annulus. Electronically Signed   By: Malachy MoanHeath   McCullough M.D.   On: 11/23/2016 16:11     STUDIES:  6/8 CT head > negative 6/8 CXR > no acute cardiopulmonary process, aortic atherosclerosis, calcifications of MV annulus  CULTURES: none  ANTIBIOTICS: None   SIGNIFICANT EVENTS: 6/8 Admit with severe hyponatremia/ confusion  LINES/TUBES: PIV   DISCUSSION: 83 y/o woman with severe hyponatremia, mixed etiology - hypovolemia, low solute intake, also likely exacerbated by thiazide. Rapid correction with 3% suggests SIADH not playing major role.  ASSESSMENT / PLAN:  PULMONARY A: No acute issues CXR neg P:   Pulmonary hygiene   CARDIOVASCULAR A:  Hx HTN P:  Routine monitoring.  RENAL A:   Hypo-osmolar hyponatremia (Na 142 on 5/11) P:   Mixed etiology - hypovolemia, low solute intake, also likely exacerbated by thiazide. Rapid correction with 3% suggests SIADH not playing major role. NPO for now, fluids to [facility]. Will monitor and allow Na to equilibrate. Then allow PO intake and may give more fluids.  GASTROINTESTINAL A:   No acute issues   P:   HEMATOLOGIC A:   Leukopenia - afebrile, no s/s of infection  P:  SCDs Trend CBC   INFECTIOUS A:   No acute issues   P:   Monitor fever curve Trend WBC  ENDOCRINE A:   DM type 2    P:   Trend glucose Check TSH  NEUROLOGIC A:   Acute encephalopathy Head CT neg P:   Will correct Na as above.  FAMILY  - Updates: No family or friends at bedside.   - Inter-disciplinary family meet or Palliative Care meeting due by:  6/15  STAFF NOTE: I have personally seen and evaluated the patient and reviewed the available data. I have discussed the patient with the housestaff officer or NP. I agree with the resident's note as documented above.  Briefly: This is an 83 y/o woman who lives independently who was found confused with extreme hypo-osm hypoNa. Likely mixed etiology with low solute intake and hypovolemia as major drivers. Unclear what may have been initial  trigger.  The patient is critically ill with multiple organ systems failure and requires high complexity decision making for assessment and support, frequent evaluation and titration of therapies, application of advanced monitoring technologies and extensive interpretation of multiple databases. Critical Care Time devoted to patient care services described in this note is 35 minutes.  Jamie Kato, MD Pulmonary & Critical Care Medicine November 23, 2016, 10:37 PM  Pulmonary and Critical Care Medicine [facility] Pager: [phone]  11/23/2016, 6:29 PM

## 2016-11-23 NOTE — ED Notes (Signed)
Pt recently moved here 2 months ago -- went to neighbor's apt today, told her she was weak, unable to walk independently, neighbor states pt normally has energy, but has been feeling "sick" past week--

## 2016-11-23 NOTE — ED Notes (Signed)
Sodium -- 109 -- lab called critical value.

## 2016-11-23 NOTE — H&P (Addendum)
History and Physical        Hospital Admission Note Date: 11/23/2016  Patient name: Monica Cohen Medical record number: [id number] Date of birth: [birth year] Age: 83 y.o. Gender: female  PCP: Patient, No Pcp Per    Patient coming from: Home  I have reviewed all records in the [facility].    Chief Complaint:  Confused  HPI: Patient is a 83 year old female with history of diabetes mellitus, hypertension who apparently has moved to this area 2 months ago was brought to [facility] ED by her neighbor for acute mental status changes today. Patient is confused and only oriented to herself and knows that she is in the hospital, otherwise she is not able to provide any history. Per neighbor, patient is very active, walks her dog 3-4 times daily, talkative and upbeat person at baseline. She had not seen the patient for a week hence she does not know what happened in the last 1 week. Today, patient came over to her neighbor's house and was holding onto the things and repeating things over, looked very weak and not herself. Per the neighbor, she brought the patient to the hospital. Per the neighbor, patient's son lives in [place] however he is a dialysis patient and the patient has been living alone independently. Patient is confused and unable to state what medications she is on. She did say that she was not feeling well and had few episodes of vomiting this week.  ED work-up/course:  Temp 97.7, respiratory rate 19, heart rate 82, BP 119/53, O2 sats 99% on room air BMET showed sodium 109, unknown baseline (patient recently moved to [place], she is unable to state from where she moved from due to her mental status), his potassium 3.2, LFTs normal, WBC 2.6, hemoglobin 12.6, glucose 134, UA negative for UTI Urine osmolarity 59, urine sodium less than 10  Review of Systems: Positives  marked in 'bold' Unable to obtain from the patient due to her mental status  Past Medical History: Past Medical History:  Diagnosis Date  . Diabetes mellitus without complication (HCC)   . Hypertension     History reviewed. No pertinent surgical history.  Medications: Prior to Admission medications   Medication Sig Start Date End Date Taking? Authorizing Provider  aspirin 325 MG tablet Take 325 mg by mouth daily.   Yes [provider]  glipiZIDE (GLUCOTROL) 5 MG tablet Take 0.5 tablets (2.5 mg total) by mouth daily before breakfast. 07/26/16  Yes Dorena Bodo, NP  lisinopril-hydrochlorothiazide (PRINZIDE,ZESTORETIC) 20-25 MG tablet Take 1 tablet by mouth daily. 07/26/16  Yes Dorena Bodo, NP  meloxicam (MOBIC) 7.5 MG tablet Take 1 tablet (7.5 mg total) by mouth daily. Patient not taking: Reported on 11/23/2016 07/26/16   Dorena Bodo, NP    Allergies:  No Known Allergies  Social History: Prior records reports that she has never smoked. She has never used smokeless tobacco. She reports that she does not drink alcohol. Her drug history is not on file.  Family History: Unable to assess any family history due to her mental status  Physical Exam: Blood pressure (!) 119/53, pulse 77, temperature 98.5 F (36.9 C), temperature source Rectal, resp.  rate 19, SpO2 99 %. General: Alert, awake, somewhat confused, oriented x2 self, she is in the hospital, in no acute distress. Eyes: pink conjunctiva,anicteric sclera, pupils equal and reactive to light and accomodation, HEENT: normocephalic, atraumatic, oropharynx clear, no tongue biting Neck: supple, no masses or lymphadenopathy, no goiter, no bruits, no JVD CVS: Regular rate and rhythm, without murmurs, rubs or gallops. No lower extremity edema Resp : Clear to auscultation bilaterally, no wheezing, rales or rhonchi. GI : Soft, nontender, nondistended, positive bowel sounds, no masses. No hepatomegaly. No hernia.    Musculoskeletal: No clubbing or cyanosis, positive pedal pulses. No contracture. ROM intact , no injuries Neuro:  strength 5/5 upper and lower extremities bilaterally, able to follow commands after a few attempts Psych: confused Skin: no rashes or lesions, warm and dry   LABS on Admission: I have personally reviewed all the labs and imagings below    Basic Metabolic Panel:  Recent Labs Lab 11/23/16 1359  NA 109*  K 3.2*  CL 77*  CO2 23  GLUCOSE 134*  BUN 7  CREATININE 0.91  CALCIUM 9.0   Liver Function Tests:  Recent Labs Lab 11/23/16 1359  AST 33  ALT 21  ALKPHOS 49  BILITOT 1.5*  PROT 6.5  ALBUMIN 3.9    Recent Labs Lab 11/23/16 1359  LIPASE 41   No results for input(s): AMMONIA in the last 168 hours. CBC:  Recent Labs Lab 11/23/16 1359  WBC 2.6*  HGB 12.6  HCT 34.6*  MCV 77.2*  PLT 236   Cardiac Enzymes: No results for input(s): CKTOTAL, CKMB, CKMBINDEX, TROPONINI in the last 168 hours. BNP: Invalid input(s): POCBNP CBG:  Recent Labs Lab 11/23/16 1401  GLUCAP 149*    Radiological Exams on Admission:  Dg Chest Port 1 View  Result Date: 11/23/2016 CLINICAL DATA:  83 year old female with weakness, back pain and hypertension EXAM: PORTABLE CHEST 1 VIEW COMPARISON:  None. FINDINGS: Cardiac and mediastinal contours are within normal limits. Atherosclerotic calcifications are present in the transverse aorta. No focal airspace consolidation, pleural effusion, pneumothorax or pulmonary edema. Calcifications project over the mitral annulus. The osseous structures are intact and unremarkable. IMPRESSION: 1. No acute cardiopulmonary process. 2.  Aortic Atherosclerosis (ICD10-170.0) 3. Calcifications of the mitral valve annulus. Electronically Signed   By: Malachy MoanHeath  McCullough M.D.   On: 11/23/2016 16:11      EKG: Independently reviewed.Rate 82, normal sinus rhythm, no acute ST-T wave changes   Assessment/Plan Principal Problem:   Hyponatremia acute,  severe hypo-osmolar hyponatremia  - Unknown baseline, calculated serum osmolarity  232, urine osmolarity 59, urine sodium less than 10 - Obtain TSH, Serum osmolarity  - Unclear if patient was on lisinopril, HCTZ, meloxicam, however will hold all of the above medications - Patient was started on hypertonic saline in the ED at 50 mL an hour, I have cut it down to 25 mL/ hour, obtain stat BMET to assess sodium level. Goal for less than <225meq rise in Na over next 4 hours and safely. I have also discussed with CCM (Dr Dellie CatholicSommers) for consult, for now admit to stepdown. May need to be in ICU after CCM evaluation. - Check weight of the patient and titrate the hypertonic saline down or change to 0.9% normal saline, pending next BMET level - Seizure precautions   Active Problems:   Acute metabolic encephalopathy: Likely due to #1, however rule out CVA  - Obtain stat CT head without contrast  - Place on seizure precautions, follow BMET and  above management as #1     Essential hypertension - BP currently stable, hold off on lisinopril and HCTZ   - patient will not be on HCTZ at discharge     Diabetes mellitus (HCC) - Obtain hemoglobin A1c  - Place on sliding scale insulin     Hypokalemia - Replaced  DVT prophylaxis:  Lovenox   CODE STATUS:  currently on full CODE STATUS, no family members at the bedside   Consults called:  critical care medicine, discussed with Dr. Dellie Catholic   Family Communication: Admission, patients condition and plan of care including tests being ordered have been discussed with the patient's neighbor at the bedside  who indicates understanding and agree with the plan and Code Status  Admission status:  inpatient stepdown   Disposition plan: Further plan will depend as patient's clinical course evolves and further radiologic and laboratory data become available.    At the time of admission, it appears that the appropriate admission status for this patient is INPATIENT . This  is judged to be reasonable and necessary in order to provide the required intensity of service to ensure the patient's safety given the presenting symptoms of severe hyponatremia, acute metabolic encephalopathy, physical exam findings, and initial radiographic and laboratory data in the context of their chronic comorbidities.   Time Spent  Critical care time spent : 65 minutes examining the patient, discussing with EDP, critical care physician, coordinating care and management.The medical decision making on this patient was of high complexity, the critically ill patient is at high risk for clinical deterioration, therefore this is a level 3 visit.     Thad Ranger M.D. [facility] 11/23/2016, 5:57 PM Pager: [phone]  If 7PM-7AM, please contact night-coverage [email or web address] Password [email or web address]

## 2016-11-23 NOTE — ED Triage Notes (Signed)
Pt brought in today by neighbor ; pt sts back pain and N/V

## 2016-11-23 NOTE — ED Provider Notes (Signed)
[facility]-EMERGENCY DEPT Provider Note   CSN: [id number] Arrival date & time: 11/23/16  1354     History   Chief Complaint Chief Complaint  Patient presents with  . Emesis    HPI Monica Cohen is a 83 y.o. female.  HPI  Level V caveat is and is unable to remember or give clear history. 83 year old female who is fairly new to this area moving within the past 5 months presents today stating that she has been weak and has had multiple falls. Her neighbor is present who states that until recently the patient has been walking her dog 3-4 times a day and very active. She has noted over the past week that she has not seen her as active as usual today. Today she told her neighbor that she was weak and had fallen. No injury was noted. She is brought into the emergency department prior to my evaluation labs were obtained which revealed a sodium of 109. The neighbor who is present states that they have attempted to contact her son who lives here in [place]. Patient has been living independently here. It is unclear if the son is aware that she is here at this time. The patient states that she is not taking any medications although a note from [facility] urgent care in February noted that she was taking glipizide and lisinopril-hydrochlorothiazide, as well as meloxicam  Past Medical History:  Diagnosis Date  . Diabetes mellitus without complication (HCC)   . Hypertension     There are no active problems to display for this patient.   History reviewed. No pertinent surgical history.  OB History    No data available       Home Medications    Prior to Admission medications   Medication Sig Start Date End Date Taking? Authorizing Provider  glipiZIDE (GLUCOTROL) 5 MG tablet Take 0.5 tablets (2.5 mg total) by mouth daily before breakfast. 07/26/16   Dorena Bodo, NP  lisinopril-hydrochlorothiazide (PRINZIDE,ZESTORETIC) 20-25 MG tablet Take 1 tablet by mouth daily. 07/26/16   Dorena Bodo,  NP  meloxicam (MOBIC) 7.5 MG tablet Take 1 tablet (7.5 mg total) by mouth daily. 07/26/16   Dorena Bodo, NP    Family History History reviewed. No pertinent family history.  Social History Social History  Substance Use Topics  . Smoking status: Never Smoker  . Smokeless tobacco: Never Used  . Alcohol use No     Allergies   Patient has no known allergies.   Review of Systems Review of Systems  Endocrine: Positive for polydipsia.  All other systems reviewed and are negative.    Physical Exam Updated Vital Signs BP (!) 153/74   Pulse 80   Temp 97.7 F (36.5 C) (Oral)   Resp 16   SpO2 99%   Physical Exam  Constitutional: She appears well-developed and well-nourished.  HENT:  Head: Normocephalic.  Right Ear: External ear normal.  Left Ear: External ear normal.  Mouth/Throat: Oropharynx is clear and moist.  Eyes: EOM are normal. Pupils are equal, round, and reactive to light.  Neck: Normal range of motion. Neck supple.  Cardiovascular: Normal rate, regular rhythm and normal heart sounds.   Pulmonary/Chest: Effort normal and breath sounds normal.  Abdominal: Soft. Bowel sounds are normal.  Musculoskeletal: Normal range of motion.  Neurological: She is alert.  Patient oriented to person, hospital , but not specific hospital, not able to give month or year  Skin: Skin is warm and dry. Capillary refill takes less than 2  seconds.  Psychiatric: She has a normal mood and affect.  Vitals reviewed.    ED Treatments / Results  Labs (all labs ordered are listed, but only abnormal results are displayed) Labs Reviewed  COMPREHENSIVE METABOLIC PANEL - Abnormal; Notable for the following:       Result Value   Sodium 109 (*)    Potassium 3.2 (*)    Chloride 77 (*)    Glucose, Bld 134 (*)    Total Bilirubin 1.5 (*)    GFR calc non Af Amer 55 (*)    All other components within normal limits  CBC - Abnormal; Notable for the following:    WBC 2.6 (*)    HCT 34.6 (*)     MCV 77.2 (*)    MCHC 36.4 (*)    All other components within normal limits  CBG MONITORING, ED - Abnormal; Notable for the following:    Glucose-Capillary 149 (*)    All other components within normal limits  LIPASE, BLOOD  URINALYSIS, ROUTINE W REFLEX MICROSCOPIC  SODIUM  SODIUM  SODIUM  OSMOLALITY  OSMOLALITY, URINE  SODIUM, URINE, RANDOM    EKG  EKG Interpretation  Date/Time:  Friday November 23 2016 15:26:39 EDT Ventricular Rate:  82 PR Interval:    QRS Duration: 90 QT Interval:  363 QTC Calculation: 424 R Axis:   68 Text Interpretation:  Sinus rhythm Consider right atrial enlargement Non-specific ST-t changes Confirmed by Kalley Nicholl MD, Duwayne HeckANIELLE [phone]([id number]) on 11/23/2016 3:32:41 PM       Radiology Dg Chest Port 1 View  Result Date: 11/23/2016 CLINICAL DATA:  83 year old female with weakness, back pain and hypertension EXAM: PORTABLE CHEST 1 VIEW COMPARISON:  None. FINDINGS: Cardiac and mediastinal contours are within normal limits. Atherosclerotic calcifications are present in the transverse aorta. No focal airspace consolidation, pleural effusion, pneumothorax or pulmonary edema. Calcifications project over the mitral annulus. The osseous structures are intact and unremarkable. IMPRESSION: 1. No acute cardiopulmonary process. 2.  Aortic Atherosclerosis (ICD10-170.0) 3. Calcifications of the mitral valve annulus. Electronically Signed   By: Malachy MoanHeath  McCullough M.D.   On: 11/23/2016 16:11    Procedures Procedures (including critical care time)  Medications Ordered in ED Medications  sodium chloride (hypertonic) 3 % solution (not administered)     Initial Impression / Assessment and Plan / ED Course  I have reviewed the triage vital signs and the nursing notes.  Pertinent labs & imaging results that were available during my care of the patient were reviewed by me and considered in my medical decision making (see chart for details).     Patient presents with weakness and found  to be profoundly hyponatremic.  No evidence of seizures here.  Patient is new to area and no old record found except one urgent care visit where patient had meds refilled and shoulder pain.  Today she reports taking no medications but increased water intake.  Patient started on hyponatremia order set with hypertonic saline at slower rate of 50/hour as this likely acute on chronic.  The only additional historian is a neighbor who reports patient has lived here 5 months and she has noted her less active for one week.   Discussed with Dr. Isidoro Donningai and plan admission.  Final Clinical Impressions(s) / ED Diagnoses   Final diagnoses:  Hyponatremia  Altered mental status, unspecified altered mental status type    New Prescriptions New Prescriptions   No medications on file     Margarita Grizzleay, Shela Esses, MD 11/23/16 1657

## 2016-11-23 NOTE — ED Notes (Signed)
Admitting at the bedside, will collect labs after they leave.

## 2016-11-23 NOTE — ED Notes (Signed)
Placed Pt on Purewick external catheter.

## 2016-11-23 NOTE — Progress Notes (Signed)
--  eLink Physician-Brief Progress Note Patient Name: Monica AverSusan Fulwider DOB: [birth year] MRN: [id number]   Date of Service  11/23/2016  HPI/Events of Note  Na+ = 109 --> 114.   eICU Interventions  Will order: 1. D/C 3% NaCl. 2. 0.9 NaCl to run IV at 75 mL/hour.  3. Continue to monitor NaCl Q 4 hours.      Intervention Category Major Interventions: Electrolyte abnormality - evaluation and management  Sommer,Steven Eugene 11/23/2016, 9:02 PM

## 2016-11-23 NOTE — Progress Notes (Signed)
eLink Physician-Brief Progress Note Patient Name: Monica Cohen DOB: 1926/06/15 MRN: [id number]   Date of Service  11/23/2016  HPI/Events of Note  Patient admitted with hyponatremia and confusion.  She was placed on 3% hypertonic saline with rapid correction of Na from 109 to 118.  Labs suggest a hypovolemic hyponatremia a but patient also on a thiazide diuretic.  This has been stopped.  Urine Na less than 1 and Urine osm of less than 50 suggests hypovolemic hyponatremia but BP is  Stable.  eICU Interventions  For now will reduce the NS 3% had been d/ced earlier Continue with NPO status thorough the night Consider TSH and cortisol labs     Intervention Category Major Interventions: Electrolyte abnormality - evaluation and management  DETERDING,ELIZABETH 11/23/2016, 9:55 PM

## 2016-11-23 NOTE — Progress Notes (Signed)
CRITICAL VALUE ALERT  Critical Value:  Osmolality 236  Date & Time Notied:  11/23/16 7:43 PM   Provider Notified: Toniann FailKakrakandy  Orders Received/Actions taken:

## 2016-11-24 ENCOUNTER — Encounter (HOSPITAL_COMMUNITY): Payer: Self-pay

## 2016-11-24 DIAGNOSIS — E871 Hypo-osmolality and hyponatremia: Principal | ICD-10-CM

## 2016-11-24 DIAGNOSIS — G9341 Metabolic encephalopathy: Secondary | ICD-10-CM

## 2016-11-24 LAB — BASIC METABOLIC PANEL
ANION GAP: 7 (ref 5–15)
ANION GAP: 8 (ref 5–15)
Anion gap: 10 (ref 5–15)
Anion gap: 9 (ref 5–15)
BUN: 6 mg/dL (ref 6–20)
BUN: 6 mg/dL (ref 6–20)
BUN: 5 mg/dL — AB (ref 6–20)
BUN: 6 mg/dL (ref 6–20)
CALCIUM: 9.2 mg/dL (ref 8.9–10.3)
CHLORIDE: 94 mmol/L — AB (ref 101–111)
CO2: 22 mmol/L (ref 22–32)
CO2: 24 mmol/L (ref 22–32)
CO2: 24 mmol/L (ref 22–32)
CO2: 24 mmol/L (ref 22–32)
CREATININE: 0.79 mg/dL (ref 0.44–1.00)
Calcium: 9.4 mg/dL (ref 8.9–10.3)
Calcium: 9.4 mg/dL (ref 8.9–10.3)
Calcium: 9.3 mg/dL (ref 8.9–10.3)
Chloride: 88 mmol/L — ABNORMAL LOW (ref 101–111)
Chloride: 89 mmol/L — ABNORMAL LOW (ref 101–111)
Chloride: 94 mmol/L — ABNORMAL LOW (ref 101–111)
Creatinine, Ser: 0.93 mg/dL (ref 0.44–1.00)
Creatinine, Ser: 0.92 mg/dL (ref 0.44–1.00)
Creatinine, Ser: 0.96 mg/dL (ref 0.44–1.00)
GFR calc Af Amer: >60 mL/min (ref >60)
GFR calc Af Amer: 59 mL/min — ABNORMAL LOW (ref >60)
GFR calc non Af Amer: >60 mL/min (ref >60)
GFR calc non Af Amer: 53 mL/min — ABNORMAL LOW (ref >60)
GFR, EST NON AFRICAN AMERICAN: 54 mL/min — AB (ref >60)
GFR, EST NON AFRICAN AMERICAN: 51 mL/min — AB (ref >60)
GLUCOSE: 113 mg/dL — AB (ref 65–99)
GLUCOSE: 139 mg/dL — AB (ref 65–99)
Glucose, Bld: 77 mg/dL (ref 65–99)
Glucose, Bld: 84 mg/dL (ref 65–99)
POTASSIUM: 4.2 mmol/L (ref 3.5–5.1)
POTASSIUM: 3.8 mmol/L (ref 3.5–5.1)
Potassium: 3.8 mmol/L (ref 3.5–5.1)
Potassium: 3.6 mmol/L (ref 3.5–5.1)
SODIUM: 122 mmol/L — AB (ref 135–145)
Sodium: 122 mmol/L — ABNORMAL LOW (ref 135–145)
Sodium: 125 mmol/L — ABNORMAL LOW (ref 135–145)
Sodium: 124 mmol/L — ABNORMAL LOW (ref 135–145)

## 2016-11-24 LAB — CBC
HEMATOCRIT: 36 % (ref 36.0–46.0)
Hemoglobin: 13.1 g/dL (ref 12.0–15.0)
MCH: 28.1 pg (ref 26.0–34.0)
MCHC: 36.4 g/dL — AB (ref 30.0–36.0)
MCV: 77.3 fL — ABNORMAL LOW (ref 78.0–100.0)
PLATELETS: 212 10*3/uL (ref 150–400)
RBC: 4.66 MIL/uL (ref 3.87–5.11)
RDW: 12.4 % (ref 11.5–15.5)
WBC: 3.4 10*3/uL — ABNORMAL LOW (ref 4.0–10.5)

## 2016-11-24 LAB — SODIUM: SODIUM: 122 mmol/L — AB (ref 135–145)

## 2016-11-24 LAB — MRSA PCR SCREENING: MRSA by PCR: NEGATIVE

## 2016-11-24 LAB — GLUCOSE, CAPILLARY
GLUCOSE-CAPILLARY: 83 mg/dL (ref 65–99)
GLUCOSE-CAPILLARY: 84 mg/dL (ref 65–99)
Glucose-Capillary: 93 mg/dL (ref 65–99)
Glucose-Capillary: 85 mg/dL (ref 65–99)

## 2016-11-24 NOTE — Progress Notes (Signed)
Monica Cohen is A X 3 today. She required update on situation leading to hospital admission. She was concerned about her money and was reassured og what was placed in safe. RN gave her a copy of receipt. Monica Cohen feels that $10 and an empty battery case for her hearing aids are missing. She recalls asking the RN to leave her with $10 so she could get her neighbor to buy batteries. I went through her pocket book with her and found 2 X 5 dollar bills and 2 battery cases. She is still upset and I told her I would share this with Production designer, theatre/television/filmmanager.

## 2016-11-24 NOTE — Progress Notes (Signed)
PULMONARY / CRITICAL CARE MEDICINE   Name: Monica Cohen MRN: [id number] DOB: [birth year]    ADMISSION DATE:  11/23/2016 CONSULTATION DATE:  11/23/2016  REFERRING MD:  Dr. Isidoro Donning  CHIEF COMPLAINT:  Hyponatremia/ Confusion  BRIEF SUMMARY:  83 year old female with PMH of HTN, hard of hearing, and DM type 2 who presented to the ER for complaints confusion.  Patient lives alone independently and apparently lives a very active lifestyle, walking her dogs daily.  She recently moved to [place] area over the last several months.  She is a very poor historian and hard of hearing (wearing a right hearing aid).  She reports she takes her blood pressure and diabetes medications regularly but is not able to recall what they are.  Reportedly, her neighbor had not seen her over the last week and went to check on her.  She was found confused, weak, and holding on to things, so they brought her to the ER.  Patients reports some recent vomiting over the last week or so and questionable falls at home.   ED workup notable for Na 109, K 3.2, WBC 2.6, Hgb 12.6, glucose 134, UA negative, LFTs normal, urine osmolarity 59, urine sodium < 10, heart rate 82, blood pressure 119/53, resp rate 19, 99% on room air and afebrile at 97.7.  Patient was started on hypertonic saline in the ER.  TRH to admit patient, however given that patient is on 3% saline and requires ICU admission, PCCM consulted.     Of note, on review of medical chart in care everywhere, patient had routine office visit on 10/26/2016 at [facility] with Na 142.  At that time she was taking mobic, lisinopril- HCTZ 20/12.5mg  daily, and glipizide 2.5mg  daily.    SUBJECTIVE:  Pt denies acute complaints.  Reports she needs to use the bedpan.  VITAL SIGNS: BP (!) 123/49   Pulse 70   Temp 97.4 F (36.3 C) (Oral)   Resp 14   Ht 4\' 11"  (1.499 m)   Wt 126 lb 15.8 oz (57.6 kg)   SpO2 97%   BMI 25.65 kg/m   HEMODYNAMICS:    VENTILATOR SETTINGS: FiO2 (%):   [0 %] 0 %  INTAKE / OUTPUT: I/O last 3 completed shifts: In: 177.8 [I.V.:177.8] Out: 700 [Urine:700]  PHYSICAL EXAMINATION: General: elderly female in NAD, appears younger than stated age   HEENT: MM pink/moist Neuro: awake, alert, oriented, speech clear, MAE CV: s1s2 rrr, no m/r/g PULM: even/non-labored, lungs bilaterally clear  NG:EXBM, non-tender, bsx4 active  Extremities: warm/dry, no edema  Skin: no rashes or lesions   LABS:  BMET  Recent Labs Lab 11/23/16 2055 11/24/16 0037 11/24/16 0305  NA 118* 122*  122* 122*  K 3.2* 3.8 3.6  CL 83* 88* 89*  CO2 27 24 24   BUN 6 6 6   CREATININE 0.88 0.79 0.93  GLUCOSE 86 77 84    Electrolytes  Recent Labs Lab 11/23/16 2055 11/24/16 0037 11/24/16 0305  CALCIUM 9.3 9.2 9.4    CBC  Recent Labs Lab 11/23/16 1359 11/24/16 0305  WBC 2.6* 3.4*  HGB 12.6 13.1  HCT 34.6* 36.0  PLT 236 212    Coag's No results for input(s): APTT, INR in the last 168 hours.  Sepsis Markers No results for input(s): LATICACIDVEN, PROCALCITON, O2SATVEN in the last 168 hours.  ABG No results for input(s): PHART, PCO2ART, PO2ART in the last 168 hours.  Liver Enzymes  Recent Labs Lab 11/23/16 1359  AST 33  ALT 21  ALKPHOS 49  BILITOT 1.5*  ALBUMIN 3.9    Cardiac Enzymes No results for input(s): TROPONINI, PROBNP in the last 168 hours.  Glucose  Recent Labs Lab 11/23/16 1401 11/23/16 2230  GLUCAP 149* 71    Imaging Ct Head Wo Contrast  Result Date: 11/23/2016 CLINICAL DATA:  Increased weakness for 5 months with multiple falls EXAM: CT HEAD WITHOUT CONTRAST TECHNIQUE: Contiguous axial images were obtained from the base of the skull through the vertex without intravenous contrast. COMPARISON:  None. FINDINGS: Brain: No acute territorial infarction, hemorrhage or intracranial mass is seen. Mild periventricular white matter hypodensity likely small vessel ischemic change. Moderate atrophy. Normal ventricle size. Empty  sella. Vascular: No hyperdense vessels.  Carotid artery calcifications. Skull: Normal. Negative for fracture or focal lesion. Sinuses/Orbits: Mild mucosal thickening in the ethmoid sinuses. No acute orbital abnormality. Bilateral lens extraction. Other: None IMPRESSION: 1. No CT evidence for acute intracranial abnormality. 2. Small vessel ischemic changes of the white matter and atrophy. Electronically Signed   By: Jasmine PangKim  Fujinaga M.D.   On: 11/23/2016 18:08   Dg Chest Port 1 View  Result Date: 11/23/2016 CLINICAL DATA:  83 year old female with weakness, back pain and hypertension EXAM: PORTABLE CHEST 1 VIEW COMPARISON:  None. FINDINGS: Cardiac and mediastinal contours are within normal limits. Atherosclerotic calcifications are present in the transverse aorta. No focal airspace consolidation, pleural effusion, pneumothorax or pulmonary edema. Calcifications project over the mitral annulus. The osseous structures are intact and unremarkable. IMPRESSION: 1. No acute cardiopulmonary process. 2.  Aortic Atherosclerosis (ICD10-170.0) 3. Calcifications of the mitral valve annulus. Electronically Signed   By: Malachy MoanHeath  McCullough M.D.   On: 11/23/2016 16:11     STUDIES:  6/8 CT head > negative 6/8 CXR > no acute cardiopulmonary process, aortic atherosclerosis, calcifications of MV annulus  CULTURES: none  ANTIBIOTICS: None   SIGNIFICANT EVENTS: 6/08  Admit with severe hyponatremia/ confusion 6/09  Improved confusion  LINES/TUBES: PIV   DISCUSSION: 83 y/o woman with severe hyponatremia, mixed etiology - hypovolemia, low solute intake, also likely exacerbated by thiazide. Rapid correction with 3% suggests SIADH not playing major role.    ASSESSMENT / PLAN:  PULMONARY A: No acute issues - CXR negative P:   Pulmonary hygiene   CARDIOVASCULAR A:  Hx HTN P:  Tele monitoring  Transition out of ICU  RENAL A:   Hypo-osmolar hyponatremia (Na 142 on 5/11) - Mixed etiology - hypovolemia, low  solute intake, also likely exacerbated by thiazide, vomiting & poor PO intake. Rapid correction with 3% suggests SIADH not playing major role. P:   KVO IVF's  Diet as tolerated  GASTROINTESTINAL A:   Vomiting - prior to admit  P:  PRN zofran   HEMATOLOGIC A:   Leukopenia - afebrile, no s/s of infection  P:  Trend CBC  SCD's for DVT prophylaxis    ENDOCRINE A:   DM type 2   P:   Per primary   NEUROLOGIC A:   Acute encephalopathy Head CT neg P:   Improving with Na correction  Defer further work up to primary   FAMILY  - Updates: No family at bedside.  Patient updated on plan of care   - Inter-disciplinary family meet or Palliative Care meeting due by:  6/15  PCCM will be available PRN.  Please call back if new needs arise.   Canary BrimBrandi Ollis, NP-C  Pulmonary & Critical Care Pgr: [phone] or if no answer [phone] 11/24/2016, 7:06 AM  Attending Note:  I have examined patient, reviewed labs, studies and notes. I have discussed the case with B Ollis, and I agree with the data and plans as amended above. 83 yo woman w HTN, DM admitted with confusion, found to have severe hyponatremia. She was fairly rapidly corrected, today at 125. On my eval she appears to have tolerated - she is up to chair, interacting normally although she is globally weak. Decreased hearing. Clear lung exam. Would continue slow correction Na, careful with thiazide diuretic especially in setting decreased PO intake. Please call if we can assist in any way.   Levy Pupa, MD, PhD 11/24/2016, 2:52 PM [facility] [phone] or if no answer [phone]

## 2016-11-24 NOTE — Evaluation (Signed)
Clinical/Bedside Swallow Evaluation Patient Details  Name: Monica Cohen MRN: [id number] Date of Birth: Mar 28, 1926  Today's Date: 11/24/2016 Time: SLP Start Time (ACUTE ONLY): 1017 SLP Stop Time (ACUTE ONLY): 1027 SLP Time Calculation (min) (ACUTE ONLY): 10 min  Past Medical History:  Past Medical History:  Diagnosis Date  . Diabetes mellitus without complication (HCC)   . Hypertension    Past Surgical History: History reviewed. No pertinent surgical history. HPI:  83 y/o woman with severe hyponatremia, mixed etiology - hypovolemia, low solute intake, also likely exacerbated by thiazide.CXR negative.    Assessment / Plan / Recommendation Clinical Impression  Patient presents with normal oropharyngeal swallowing function. No further SLP needs indicated.  SLP Visit Diagnosis: Dysphagia, unspecified (R13.10)    Aspiration Risk  No limitations    Diet Recommendation Regular;Thin liquid   Liquid Administration via: Cup;Straw Medication Administration: Whole meds with liquid Supervision: Patient able to self feed Compensations: Slow rate;Small sips/bites Postural Changes: Seated upright at 90 degrees    Other  Recommendations Oral Care Recommendations: Oral care BID   Follow up Recommendations None        Swallow Study   General HPI: 83 y/o woman with severe hyponatremia, mixed etiology - hypovolemia, low solute intake, also likely exacerbated by thiazide.CXR negative.  Type of Study: Bedside Swallow Evaluation Previous Swallow Assessment: none noted Diet Prior to this Study: NPO Temperature Spikes Noted: No Respiratory Status: Room air History of Recent Intubation: No Behavior/Cognition: Alert;Cooperative;Pleasant mood Oral Cavity Assessment: Within Functional Limits Oral Care Completed by SLP: Recent completion by staff Oral Cavity - Dentition: Adequate natural dentition Vision: Functional for self-feeding Self-Feeding Abilities: Able to feed self Patient Positioning:  Upright in bed Baseline Vocal Quality: Low vocal intensity Volitional Cough: Strong Volitional Swallow: Able to elicit    Oral/Motor/Sensory Function Overall Oral Motor/Sensory Function: Within functional limits   Ice Chips Ice chips: Not tested   Thin Liquid Thin Liquid: Within functional limits Presentation: Straw;Self Fed    Nectar Thick Nectar Thick Liquid: Not tested   Honey Thick Honey Thick Liquid: Not tested   Puree Puree: Within functional limits Presentation: Self Fed;Spoon   Solid   GO   Solid: Within functional limits Presentation: Self Fed       Monica Lindquist MA, CCC-SLP [phone]  Monica Cohen 11/24/2016,10:30 AM

## 2016-11-24 NOTE — Progress Notes (Signed)
[facility]                                                                              Patient Demographics  Monica Cohen, is a 83 y.o. female, DOB - 02-28-1926, JYN:[id number]  Admit date - 11/23/2016   Admitting Physician Jaeson Molstad Jenna Luo, MD  Outpatient Primary MD for the patient is Patient, No Pcp Per  Outpatient specialists:   LOS - 1  days   Medical records reviewed and are as summarized below:    Chief Complaint  Patient presents with  . Emesis       Brief summary   Patient is a 83 year old female with diabetes, hypertension, recently moved to [place] 2 months ago presented with acute mental status changes, found to have severe hyponatremia 109 with unknown baseline. Patient was brought by her neighbor, at baseline, physically active person.   Assessment & Plan    Principal Problem: Acute  Hyponatremia with severe hypoosmolar hyponatremia - Serum osm 239, urine osmolarity 59, urine sodium less than 10, rapid correction with 3% suggesting SIADH not the etiology. Patient was started on 3% normal saline in ED, lowered to 25 mL/ hour and change to 0.9% normal saline after Na corrected from 109 to 114 - Currently sodium 122, on 0.9% normal saline at 30 mL/ hour - Patient is alert and oriented x3, no focal neurological deficits, reports that she had episodes of nausea and vomiting with poor appetite in the past week, superimposed with her taking thiazide diuretic likely caused severe dehydration - will obtain stat BMET, diet advanced. TSH 1.0 - Start PT, transfer to stepdown unit   Active Problems:   Acute metabolic encephalopathy: Likely due to severe hyponatremia - CT head showed no acute CVA or intracranial abnormality - Likely due to #1, currently alert and oriented     Essential hypertension - Currently stable, hold off on outpatient antihypertensives, will discontinue thiazide from outpatient antihypertensives     Diabetes mellitus (HCC):  type 2 - Currently on regular diet - CBGs 83, 71, continue sliding scale insulin as needed - Obtain hemoglobin A1c    Hypokalemia - Resolved   Code Status: Full CODE STATUS DVT Prophylaxis:  Lovenox Family Communication: Discussed in detail with the patient, all imaging results, lab results explained to the patient. No family member at the bedside    Disposition Plan:Transfer to stepdown unit  Time Spent in minutes 35 minutes  Procedures:  CT head   Consultants:   Critical care  Antimicrobials :   None    Medications  Scheduled Meds: . enoxaparin (LOVENOX) injection  40 mg Subcutaneous Q24H  . insulin aspart  0-5 Units Subcutaneous QHS  . insulin aspart  0-9 Units Subcutaneous TID WC   Continuous Infusions: . sodium chloride 30 mL/hr at 11/24/16 0700   PRN Meds:.acetaminophen **OR** acetaminophen, LORazepam, ondansetron **OR** ondansetron (ZOFRAN) IV   Antibiotics   Anti-infectives    None        Subjective:   Monica Cohen was seen and examined today.  Feels a lot better today.alert and oriented 3.  Patient denies dizziness, chest pain, shortness of breath, abdominal  pain, N/V/D/C, new weakness, numbess, tingling. No acute events overnight. No fevers.    Objective:   Vitals:   11/24/16 0700 11/24/16 0800 11/24/16 0900 11/24/16 1000  BP: (!) 123/56  (!) 114/56   Pulse: 76 73 62 69  Resp: 17 18 14 14   Temp:      TempSrc:      SpO2: 98% 100% 97% 96%  Weight:      Height:        Intake/Output Summary (Last 24 hours) at 11/24/16 1043 Last data filed at 11/24/16 1000  Gross per 24 hour  Intake           477.83 ml  Output             1000 ml  Net          -522.17 ml     Wt Readings from Last 3 Encounters:  11/23/16 57.6 kg (126 lb 15.8 oz)     Exam  General: Alert and oriented x 3, NAD  Eyes: PERRLA, EOMI, Anicteric Sclera,  HEENT:  Atraumatic, normocephalic, normal oropharynx  Cardiovascular: S1 S2 auscultated, no rubs, murmurs or  gallops. Regular rate and rhythm.  Respiratory: Clear to auscultation bilaterally, no wheezing, rales or rhonchi  Gastrointestinal: Soft, nontender, nondistended, + bowel sounds  Ext: no pedal edema bilaterally  Neuro: AAOx3, Cr N's II- XII. Strength 5/5 upper and lower extremities bilaterally, speech clear, sensations grossly intact  Musculoskeletal: No digital cyanosis, clubbing  Skin: No rashes  Psych: Normal affect and demeanor, alert and oriented x3    Data Reviewed:  I have personally reviewed following labs and imaging studies  Micro Results Recent Results (from the past 240 hour(s))  MRSA PCR Screening     Status: None   Collection Time: 11/24/16  2:51 AM  Result Value Ref Range Status   MRSA by PCR NEGATIVE NEGATIVE Final    Comment:        The GeneXpert MRSA Assay (FDA approved for NASAL specimens only), is one component of a comprehensive MRSA colonization surveillance program. It is not intended to diagnose MRSA infection nor to guide or monitor treatment for MRSA infections.     Radiology Reports Ct Head Wo Contrast  Result Date: 11/23/2016 CLINICAL DATA:  Increased weakness for 5 months with multiple falls EXAM: CT HEAD WITHOUT CONTRAST TECHNIQUE: Contiguous axial images were obtained from the base of the skull through the vertex without intravenous contrast. COMPARISON:  None. FINDINGS: Brain: No acute territorial infarction, hemorrhage or intracranial mass is seen. Mild periventricular white matter hypodensity likely small vessel ischemic change. Moderate atrophy. Normal ventricle size. Empty sella. Vascular: No hyperdense vessels.  Carotid artery calcifications. Skull: Normal. Negative for fracture or focal lesion. Sinuses/Orbits: Mild mucosal thickening in the ethmoid sinuses. No acute orbital abnormality. Bilateral lens extraction. Other: None IMPRESSION: 1. No CT evidence for acute intracranial abnormality. 2. Small vessel ischemic changes of the white  matter and atrophy. Electronically Signed   By: Jasmine PangKim  Fujinaga M.D.   On: 11/23/2016 18:08   Dg Chest Port 1 View  Result Date: 11/23/2016 CLINICAL DATA:  83 year old female with weakness, back pain and hypertension EXAM: PORTABLE CHEST 1 VIEW COMPARISON:  None. FINDINGS: Cardiac and mediastinal contours are within normal limits. Atherosclerotic calcifications are present in the transverse aorta. No focal airspace consolidation, pleural effusion, pneumothorax or pulmonary edema. Calcifications project over the mitral annulus. The osseous structures are intact and unremarkable. IMPRESSION: 1. No acute cardiopulmonary process. 2.  Aortic Atherosclerosis (  ICD10-170.0) 3. Calcifications of the mitral valve annulus. Electronically Signed   By: Malachy Moan M.D.   On: 11/23/2016 16:11    Lab Data:  CBC:  Recent Labs Lab 11/23/16 1359 11/24/16 0305  WBC 2.6* 3.4*  HGB 12.6 13.1  HCT 34.6* 36.0  MCV 77.2* 77.3*  PLT 236 212   Basic Metabolic Panel:  Recent Labs Lab 11/23/16 1359 11/23/16 1834 11/23/16 2055 11/24/16 0037 11/24/16 0305  NA 109* 114* 118* 122*  122* 122*  K 3.2* 3.3* 3.2* 3.8 3.6  CL 77* 80* 83* 88* 89*  CO2 23 23 27 24 24   GLUCOSE 134* 97 86 77 84  BUN 7 7 6 6 6   CREATININE 0.91 0.86 0.88 0.79 0.93  CALCIUM 9.0 9.0 9.3 9.2 9.4   GFR: Estimated Creatinine Clearance: 32.3 mL/min (by C-G formula based on SCr of 0.93 mg/dL). Liver Function Tests:  Recent Labs Lab 11/23/16 1359  AST 33  ALT 21  ALKPHOS 49  BILITOT 1.5*  PROT 6.5  ALBUMIN 3.9    Recent Labs Lab 11/23/16 1359  LIPASE 41   No results for input(s): AMMONIA in the last 168 hours. Coagulation Profile: No results for input(s): INR, PROTIME in the last 168 hours. Cardiac Enzymes: No results for input(s): CKTOTAL, CKMB, CKMBINDEX, TROPONINI in the last 168 hours. BNP (last 3 results) No results for input(s): PROBNP in the last 8760 hours. HbA1C: No results for input(s): HGBA1C in the  last 72 hours. CBG:  Recent Labs Lab 11/23/16 1401 11/23/16 2230 11/24/16 0735  GLUCAP 149* 71 83   Lipid Profile: No results for input(s): CHOL, HDL, LDLCALC, TRIG, CHOLHDL, LDLDIRECT in the last 72 hours. Thyroid Function Tests:  Recent Labs  11/23/16 2055  TSH 1.033   Anemia Panel: No results for input(s): VITAMINB12, FOLATE, FERRITIN, TIBC, IRON, RETICCTPCT in the last 72 hours. Urine analysis:    Component Value Date/Time   COLORURINE STRAW (A) 11/23/2016 1523   APPEARANCEUR CLEAR 11/23/2016 1523   LABSPEC 1.001 (L) 11/23/2016 1523   PHURINE 7.0 11/23/2016 1523   GLUCOSEU NEGATIVE 11/23/2016 1523   HGBUR NEGATIVE 11/23/2016 1523   BILIRUBINUR NEGATIVE 11/23/2016 1523   KETONESUR NEGATIVE 11/23/2016 1523   PROTEINUR NEGATIVE 11/23/2016 1523   NITRITE NEGATIVE 11/23/2016 1523   LEUKOCYTESUR TRACE (A) 11/23/2016 1523     Niya Behler M.D. [facility] 11/24/2016, 10:43 AM  Pager: [phone] Between 7am to 7pm - call Pager - [phone]  After 7pm go to [email or web address] - password [email or web address]  Call night coverage person covering after 7pm

## 2016-11-25 LAB — SODIUM, URINE, RANDOM: Sodium, Ur: 32 mmol/L

## 2016-11-25 LAB — BASIC METABOLIC PANEL
ANION GAP: 6 (ref 5–15)
Anion gap: 10 (ref 5–15)
Anion gap: 7 (ref 5–15)
BUN: <5 mg/dL — ABNORMAL LOW (ref 6–20)
BUN: <5 mg/dL — ABNORMAL LOW (ref 6–20)
BUN: 6 mg/dL (ref 6–20)
CHLORIDE: 98 mmol/L — AB (ref 101–111)
CHLORIDE: 96 mmol/L — AB (ref 101–111)
CO2: 21 mmol/L — AB (ref 22–32)
CO2: 25 mmol/L (ref 22–32)
CO2: 25 mmol/L (ref 22–32)
CREATININE: 0.87 mg/dL (ref 0.44–1.00)
CREATININE: 1.01 mg/dL — AB (ref 0.44–1.00)
Calcium: 9.2 mg/dL (ref 8.9–10.3)
Calcium: 9.2 mg/dL (ref 8.9–10.3)
Calcium: 9.1 mg/dL (ref 8.9–10.3)
Chloride: 98 mmol/L — ABNORMAL LOW (ref 101–111)
Creatinine, Ser: 1.1 mg/dL — ABNORMAL HIGH (ref 0.44–1.00)
GFR calc non Af Amer: 58 mL/min — ABNORMAL LOW (ref >60)
GFR calc non Af Amer: 44 mL/min — ABNORMAL LOW (ref >60)
GFR calc non Af Amer: 48 mL/min — ABNORMAL LOW (ref >60)
GFR, EST AFRICAN AMERICAN: 50 mL/min — AB (ref >60)
GFR, EST AFRICAN AMERICAN: 56 mL/min — AB (ref >60)
GLUCOSE: 72 mg/dL (ref 65–99)
GLUCOSE: 95 mg/dL (ref 65–99)
Glucose, Bld: 203 mg/dL — ABNORMAL HIGH (ref 65–99)
POTASSIUM: 3.8 mmol/L (ref 3.5–5.1)
Potassium: 3.8 mmol/L (ref 3.5–5.1)
Potassium: 3.8 mmol/L (ref 3.5–5.1)
SODIUM: 128 mmol/L — AB (ref 135–145)
Sodium: 129 mmol/L — ABNORMAL LOW (ref 135–145)
Sodium: 129 mmol/L — ABNORMAL LOW (ref 135–145)

## 2016-11-25 LAB — HEMOGLOBIN A1C
Hgb A1c MFr Bld: 6 % — ABNORMAL HIGH (ref 4.8–5.6)
MEAN PLASMA GLUCOSE: 126 mg/dL

## 2016-11-25 LAB — OSMOLALITY, URINE: Osmolality, Ur: 117 mOsm/kg — ABNORMAL LOW (ref 300–900)

## 2016-11-25 LAB — GLUCOSE, CAPILLARY
GLUCOSE-CAPILLARY: 221 mg/dL — AB (ref 65–99)
GLUCOSE-CAPILLARY: 84 mg/dL (ref 65–99)
Glucose-Capillary: 98 mg/dL (ref 65–99)
Glucose-Capillary: 105 mg/dL — ABNORMAL HIGH (ref 65–99)

## 2016-11-25 MED ORDER — ASPIRIN 325 MG PO TABS
325.0000 mg | ORAL_TABLET | Freq: Every day | ORAL | Status: DC
  Administered 2016-11-25 – 2016-11-26 (×2): 325 mg via ORAL
  Filled 2016-11-25 (×3): qty 1

## 2016-11-25 MED ORDER — ENOXAPARIN SODIUM 30 MG/0.3ML ~~LOC~~ SOLN
30.0000 mg | SUBCUTANEOUS | Status: DC
  Administered 2016-11-25 – 2016-11-27 (×3): 30 mg via SUBCUTANEOUS
  Filled 2016-11-25 (×3): qty 0.3

## 2016-11-25 MED ORDER — SENNOSIDES-DOCUSATE SODIUM 8.6-50 MG PO TABS
1.0000 | ORAL_TABLET | Freq: Two times a day (BID) | ORAL | Status: DC
  Administered 2016-11-25 – 2016-11-27 (×3): 1 via ORAL
  Filled 2016-11-25 (×5): qty 1

## 2016-11-25 MED ORDER — POLYETHYLENE GLYCOL 3350 17 G PO PACK
17.0000 g | PACK | Freq: Every day | ORAL | Status: DC
  Administered 2016-11-25 – 2016-11-27 (×3): 17 g via ORAL
  Filled 2016-11-25 (×4): qty 1

## 2016-11-25 MED ORDER — INSULIN ASPART 100 UNIT/ML ~~LOC~~ SOLN: 0.0000 [IU] | Freq: Every day | SUBCUTANEOUS | Status: DC

## 2016-11-25 MED ORDER — INSULIN ASPART 100 UNIT/ML ~~LOC~~ SOLN
0.0000 [IU] | Freq: Three times a day (TID) | SUBCUTANEOUS | Status: DC
  Administered 2016-11-26 – 2016-11-27 (×2): 1 [IU] via SUBCUTANEOUS

## 2016-11-25 NOTE — Progress Notes (Signed)
[facility]                                                                              Patient Demographics  Monica Cohen, is a 83 y.o. female, DOB - [birth year], ZOX:[id number]  Admit date - 11/23/2016   Admitting Physician Castor Gittleman Jenna Luo, MD  Outpatient Primary MD for the patient is Patient, No Pcp Per  Outpatient specialists:   LOS - 2  days   Medical records reviewed and are as summarized below:    Chief Complaint  Patient presents with  . Emesis       Brief summary   Patient is a 83 year old female with diabetes, hypertension, recently moved to [place] 2 months ago presented with acute mental status changes, found to have severe hyponatremia 109 with unknown baseline. Patient was brought by her neighbor, at baseline, physically active person.   Assessment & Plan    Principal Problem: Acute  Hyponatremia with severe hypoosmolar hyponatremia - Serum osm 239, urine osmolarity 59, urine sodium less than 10, rapid correction with 3% suggesting SIADH not the etiology. Patient was started on 3% normal saline in ED, lowered to 25 mL/ hour and changed to 0.9% normal saline after Na corrected from 109 to 114 -Sodium appropriately trending up to 129 at 2AM, patient alert and oriented 3, tolerating diet - start physical therapy today  -Negative balance of 670 mL, Increase IV fluids 0.9% normal saline to 60 mL an hour  Active Problems:   Acute metabolic encephalopathy: Likely due to severe hyponatremia - CT head showed no acute CVA or intracranial abnormality - Likely due to #1, currently alert and oriented     Essential hypertension - Currently stable, hold off on outpatient antihypertensives, will discontinue thiazide from outpatient antihypertensives     Diabetes mellitus (HCC): type 2 - Currently on regular diet - CBG is low, discontinue sliding scale insulin -  hemoglobin A1c 6.0    Hypokalemia - Resolved   Constipation - Placed on Senokot-S  and MiraLAX  Code Status: Full CODE STATUS DVT Prophylaxis:  Lovenox Family Communication: Discussed in detail with the patient, all imaging results, lab results explained to the patient. No family member at the bedside    Disposition Plan:  Time Spent in minutes 25 minutes  Procedures:  CT head   Consultants:   Critical care  Antimicrobials :   None    Medications  Scheduled Meds: . aspirin  325 mg Oral Daily  . enoxaparin (LOVENOX) injection  40 mg Subcutaneous Q24H  . polyethylene glycol  17 g Oral Daily  . senna-docusate  1 tablet Oral BID   Continuous Infusions: . sodium chloride 60 mL/hr at 11/25/16 0650   PRN Meds:.acetaminophen **OR** acetaminophen, LORazepam, ondansetron **OR** ondansetron (ZOFRAN) IV   Antibiotics   Anti-infectives    None        Subjective:   Monica Cohen was seen and examined today. Denies any specific complaints except constipation, alert and oriented 3.  Patient denies dizziness, chest pain, shortness of breath, abdominal pain, N/V/D/C, new weakness, numbess, tingling. No acute events overnight. No fevers.    Objective:  Vitals:   11/25/16 0000 11/25/16 0351 11/25/16 0400 11/25/16 0802  BP: (!) 140/59 99/81 (!) 114/52 (!) 144/54  Pulse:  94 90 73  Resp: (!) 21 19 17 18   Temp:  98.1 F (36.7 C)  97.7 F (36.5 C)  TempSrc:  Oral  Oral  SpO2:  99% 96% 99%  Weight:      Height:        Intake/Output Summary (Last 24 hours) at 11/25/16 1046 Last data filed at 11/25/16 0644  Gross per 24 hour  Intake             1152 ml  Output             1300 ml  Net             -148 ml     Wt Readings from Last 3 Encounters:  11/23/16 57.6 kg (126 lb 15.8 oz)     Exam  General:Alert, awake, oriented 3, NAD   Eyes:PERRLA, EOMI   HEENT: atraumatic, normocephalic   Cardiovascular:S1 and S2 clear, no MRG, RRR   Respiratory:CTA B   Gastrointestinal:Soft, ND, NT, NBS   Ext: no pedal edema bilaterally  Neuro:alert  and oriented 3, no focal neurological deficits  Musculoskeletal: No digital cyanosis, clubbing  Skin: No rashes  Psych:alert and oriented 3, normal affect  Data Reviewed:  I have personally reviewed following labs and imaging studies  Micro Results Recent Results (from the past 240 hour(s))  MRSA PCR Screening     Status: None   Collection Time: 11/24/16  2:51 AM  Result Value Ref Range Status   MRSA by PCR NEGATIVE NEGATIVE Final    Comment:        The GeneXpert MRSA Assay (FDA approved for NASAL specimens only), is one component of a comprehensive MRSA colonization surveillance program. It is not intended to diagnose MRSA infection nor to guide or monitor treatment for MRSA infections.     Radiology Reports Ct Head Wo Contrast  Result Date: 11/23/2016 CLINICAL DATA:  Increased weakness for 5 months with multiple falls EXAM: CT HEAD WITHOUT CONTRAST TECHNIQUE: Contiguous axial images were obtained from the base of the skull through the vertex without intravenous contrast. COMPARISON:  None. FINDINGS: Brain: No acute territorial infarction, hemorrhage or intracranial mass is seen. Mild periventricular white matter hypodensity likely small vessel ischemic change. Moderate atrophy. Normal ventricle size. Empty sella. Vascular: No hyperdense vessels.  Carotid artery calcifications. Skull: Normal. Negative for fracture or focal lesion. Sinuses/Orbits: Mild mucosal thickening in the ethmoid sinuses. No acute orbital abnormality. Bilateral lens extraction. Other: None IMPRESSION: 1. No CT evidence for acute intracranial abnormality. 2. Small vessel ischemic changes of the white matter and atrophy. Electronically Signed   By: Jasmine Pang M.D.   On: 11/23/2016 18:08   Dg Chest Port 1 View  Result Date: 11/23/2016 CLINICAL DATA:  83 year old female with weakness, back pain and hypertension EXAM: PORTABLE CHEST 1 VIEW COMPARISON:  None. FINDINGS: Cardiac and mediastinal contours are  within normal limits. Atherosclerotic calcifications are present in the transverse aorta. No focal airspace consolidation, pleural effusion, pneumothorax or pulmonary edema. Calcifications project over the mitral annulus. The osseous structures are intact and unremarkable. IMPRESSION: 1. No acute cardiopulmonary process. 2.  Aortic Atherosclerosis (ICD10-170.0) 3. Calcifications of the mitral valve annulus. Electronically Signed   By: Malachy Moan M.D.   On: 11/23/2016 16:11    Lab Data:  CBC:  Recent Labs Lab 11/23/16 1359 11/24/16 0305  WBC  2.6* 3.4*  HGB 12.6 13.1  HCT 34.6* 36.0  MCV 77.2* 77.3*  PLT 236 212   Basic Metabolic Panel:  Recent Labs Lab 11/24/16 0037 11/24/16 0305 11/24/16 1100 11/24/16 1856 11/25/16 0202  NA 122*  122* 122* 125* 124* 129*  K 3.8 3.6 4.2 3.8 3.8  CL 88* 89* 94* 94* 98*  CO2 24 24 24 22  21*  GLUCOSE 77 84 113* 139* 72  BUN 6 6 5* 6 <5*  CREATININE 0.79 0.93 0.92 0.96 0.87  CALCIUM 9.2 9.4 9.4 9.3 9.2   GFR: Estimated Creatinine Clearance: 34.6 mL/min (by C-G formula based on SCr of 0.87 mg/dL). Liver Function Tests:  Recent Labs Lab 11/23/16 1359  AST 33  ALT 21  ALKPHOS 49  BILITOT 1.5*  PROT 6.5  ALBUMIN 3.9    Recent Labs Lab 11/23/16 1359  LIPASE 41   No results for input(s): AMMONIA in the last 168 hours. Coagulation Profile: No results for input(s): INR, PROTIME in the last 168 hours. Cardiac Enzymes: No results for input(s): CKTOTAL, CKMB, CKMBINDEX, TROPONINI in the last 168 hours. BNP (last 3 results) No results for input(s): PROBNP in the last 8760 hours. HbA1C:  Recent Labs  11/23/16 2055  HGBA1C 6.0*   CBG:  Recent Labs Lab 11/24/16 0735 11/24/16 1127 11/24/16 1148 11/24/16 2133 11/25/16 0800  GLUCAP 83 93 85 84 98   Lipid Profile: No results for input(s): CHOL, HDL, LDLCALC, TRIG, CHOLHDL, LDLDIRECT in the last 72 hours. Thyroid Function Tests:  Recent Labs  11/23/16 2055  TSH  1.033   Anemia Panel: No results for input(s): VITAMINB12, FOLATE, FERRITIN, TIBC, IRON, RETICCTPCT in the last 72 hours. Urine analysis:    Component Value Date/Time   COLORURINE STRAW (A) 11/23/2016 1523   APPEARANCEUR CLEAR 11/23/2016 1523   LABSPEC 1.001 (L) 11/23/2016 1523   PHURINE 7.0 11/23/2016 1523   GLUCOSEU NEGATIVE 11/23/2016 1523   HGBUR NEGATIVE 11/23/2016 1523   BILIRUBINUR NEGATIVE 11/23/2016 1523   KETONESUR NEGATIVE 11/23/2016 1523   PROTEINUR NEGATIVE 11/23/2016 1523   NITRITE NEGATIVE 11/23/2016 1523   LEUKOCYTESUR TRACE (A) 11/23/2016 1523     Ursula Dermody M.D. [facility] 11/25/2016, 10:46 AM  Pager: [phone] Between 7am to 7pm - call Pager - [phone]  After 7pm go to [email or web address] - password [email or web address]  Call night coverage person covering after 7pm
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
## 2016-11-26 LAB — BASIC METABOLIC PANEL
ANION GAP: 9 (ref 5–15)
BUN: <5 mg/dL — ABNORMAL LOW (ref 6–20)
CO2: 22 mmol/L (ref 22–32)
Calcium: 9.1 mg/dL (ref 8.9–10.3)
Chloride: 101 mmol/L (ref 101–111)
Creatinine, Ser: 0.88 mg/dL (ref 0.44–1.00)
GFR, EST NON AFRICAN AMERICAN: 57 mL/min — AB (ref >60)
GLUCOSE: 86 mg/dL (ref 65–99)
POTASSIUM: 3.9 mmol/L (ref 3.5–5.1)
Sodium: 132 mmol/L — ABNORMAL LOW (ref 135–145)

## 2016-11-26 LAB — GLUCOSE, CAPILLARY
GLUCOSE-CAPILLARY: 114 mg/dL — AB (ref 65–99)
GLUCOSE-CAPILLARY: 113 mg/dL — AB (ref 65–99)
GLUCOSE-CAPILLARY: 108 mg/dL — AB (ref 65–99)
Glucose-Capillary: 125 mg/dL — ABNORMAL HIGH (ref 65–99)

## 2016-11-26 NOTE — Progress Notes (Signed)
[facility]                                                                              Patient Demographics  Monica Cohen, is a 83 y.o. female, DOB - 1925/10/17, WUJ:[id number]  Admit date - 11/23/2016   Admitting Physician Dafney Farler Jenna Luo, MD  Outpatient Primary MD for the patient is Patient, No Pcp Per  Outpatient specialists:   LOS - 3  days   Medical records reviewed and are as summarized below:    Chief Complaint  Patient presents with  . Emesis       Brief summary   Patient is a 83 year old female with diabetes, hypertension, recently moved to [place] 2 months ago presented with acute mental status changes, found to have severe hyponatremia 109 with unknown baseline. Patient was brought by her neighbor, at baseline, physically active person.   Assessment & Plan    Principal Problem: Acute  Hyponatremia with severe hypoosmolar hyponatremia - Serum osm 239, urine osmolarity 59, urine sodium less than 10, rapid correction with 3% suggesting SIADH not the etiology. Patient was started on 3% normal saline in ED, lowered to 25 mL/ hour and changed to 0.9% normal saline after Na corrected from 109 to 114 -Sodium trending up, 132 today -PT evaluation recommended home health PT - Serum osmolarity still low 249, urinalysis 117, continue gentle hydration  Active Problems:   Acute metabolic encephalopathy: Likely due to severe hyponatremia - CT head showed no acute CVA or intracranial abnormality - Likely due to #1, currently alert and oriented 3    Essential hypertension - Currently stable, hold off on outpatient antihypertensives, will discontinue thiazide from outpatient antihypertensives     Diabetes mellitus (HCC): type 2 - Hemoglobin A1c 6.0, continuous sliding scale insulin and carb modified diet     Hypokalemia - Resolved   Constipation - Placed on Senokot-S and MiraLAX  Code Status: Full CODE STATUS DVT Prophylaxis:  Lovenox Family  Communication: Discussed in detail with the patient, all imaging results, lab results explained to the patient. No family member at the bedside    Disposition Plan: Plan for DC home tomorrow however have not seen any of her family members still  Time Spent in minutes 25 minutes  Procedures:  CT head   Consultants:   Critical care  Antimicrobials :   None    Medications  Scheduled Meds: . aspirin  325 mg Oral Daily  . enoxaparin (LOVENOX) injection  30 mg Subcutaneous Q24H  . insulin aspart  0-5 Units Subcutaneous QHS  . insulin aspart  0-9 Units Subcutaneous TID WC  . polyethylene glycol  17 g Oral Daily  . senna-docusate  1 tablet Oral BID   Continuous Infusions: . sodium chloride 60 mL/hr at 11/26/16 0500   PRN Meds:.acetaminophen **OR** acetaminophen, ondansetron **OR** ondansetron (ZOFRAN) IV   Antibiotics   Anti-infectives    None        Subjective:   Monica Cohen was seen and examined today. Feels better today, alert and oriented 3, denies any complaints.  Patient denies dizziness, chest pain, shortness of breath, abdominal pain, N/V/D/C, new weakness, numbess,  tingling. No acute events overnight. No fevers.    Objective:   Vitals:   11/26/16 0321 11/26/16 0400 11/26/16 0800 11/26/16 1110  BP: (!) 133/41 (!) 120/48 131/65 (!) 141/53  Pulse: 87 74 85 87  Resp: 11 15 17 17   Temp: 97.8 F (36.6 C)   98.1 F (36.7 C)  TempSrc: Oral   Oral  SpO2: 100% 98% 100% 100%  Weight:    58.6 kg (129 lb 1.6 oz)  Height:    4\' 11"  (1.499 m)    Intake/Output Summary (Last 24 hours) at 11/26/16 1328 Last data filed at 11/26/16 1257  Gross per 24 hour  Intake             2207 ml  Output             1400 ml  Net              807 ml     Wt Readings from Last 3 Encounters:  11/26/16 58.6 kg (129 lb 1.6 oz)     Exam  General:Alert, awake, oriented 3, NAD   Eyes: EOMI PERRLA  HEENT: normocephalic, atraumatic  Cardiovascular: S1 and S2 clear, no MRG,  RRR  Respiratory: Clear to auscultation bilaterally  Gastrointestinal: Soft nontender, nondistended, normal bowel sounds  Ext: no cyanosis clubbing or edema  Neuro: no focal neurological deficits  Musculoskeletal: No digital cyanosis, clubbing  Skin: No rashes  Psych:alert and oriented 3, normal affect  Data Reviewed:  I have personally reviewed following labs and imaging studies  Micro Results Recent Results (from the past 240 hour(s))  MRSA PCR Screening     Status: None   Collection Time: 11/24/16  2:51 AM  Result Value Ref Range Status   MRSA by PCR NEGATIVE NEGATIVE Final    Comment:        The GeneXpert MRSA Assay (FDA approved for NASAL specimens only), is one component of a comprehensive MRSA colonization surveillance program. It is not intended to diagnose MRSA infection nor to guide or monitor treatment for MRSA infections.     Radiology Reports Ct Head Wo Contrast  Result Date: 11/23/2016 CLINICAL DATA:  Increased weakness for 5 months with multiple falls EXAM: CT HEAD WITHOUT CONTRAST TECHNIQUE: Contiguous axial images were obtained from the base of the skull through the vertex without intravenous contrast. COMPARISON:  None. FINDINGS: Brain: No acute territorial infarction, hemorrhage or intracranial mass is seen. Mild periventricular white matter hypodensity likely small vessel ischemic change. Moderate atrophy. Normal ventricle size. Empty sella. Vascular: No hyperdense vessels.  Carotid artery calcifications. Skull: Normal. Negative for fracture or focal lesion. Sinuses/Orbits: Mild mucosal thickening in the ethmoid sinuses. No acute orbital abnormality. Bilateral lens extraction. Other: None IMPRESSION: 1. No CT evidence for acute intracranial abnormality. 2. Small vessel ischemic changes of the white matter and atrophy. Electronically Signed   By: Jasmine Pang M.D.   On: 11/23/2016 18:08   Dg Chest Port 1 View  Result Date: 11/23/2016 CLINICAL DATA:   83 year old female with weakness, back pain and hypertension EXAM: PORTABLE CHEST 1 VIEW COMPARISON:  None. FINDINGS: Cardiac and mediastinal contours are within normal limits. Atherosclerotic calcifications are present in the transverse aorta. No focal airspace consolidation, pleural effusion, pneumothorax or pulmonary edema. Calcifications project over the mitral annulus. The osseous structures are intact and unremarkable. IMPRESSION: 1. No acute cardiopulmonary process. 2.  Aortic Atherosclerosis (ICD10-170.0) 3. Calcifications of the mitral valve annulus. Electronically Signed   By: Isac Caddy.D.  On: 11/23/2016 16:11    Lab Data:  CBC:  Recent Labs Lab 11/23/16 1359 11/24/16 0305  WBC 2.6* 3.4*  HGB 12.6 13.1  HCT 34.6* 36.0  MCV 77.2* 77.3*  PLT 236 212   Basic Metabolic Panel:  Recent Labs Lab 11/24/16 1856 11/25/16 0202 11/25/16 1108 11/25/16 1614 11/26/16 0154  NA 124* 129* 128* 129* 132*  K 3.8 3.8 3.8 3.8 3.9  CL 94* 98* 96* 98* 101  CO2 22 21* 25 25 22   GLUCOSE 139* 72 203* 95 86  BUN 6 <5* <5* 6 <5*  CREATININE 0.96 0.87 1.10* 1.01* 0.88  CALCIUM 9.3 9.2 9.2 9.1 9.1   GFR: Estimated Creatinine Clearance: 34.5 mL/min (by C-G formula based on SCr of 0.88 mg/dL). Liver Function Tests:  Recent Labs Lab 11/23/16 1359  AST 33  ALT 21  ALKPHOS 49  BILITOT 1.5*  PROT 6.5  ALBUMIN 3.9    Recent Labs Lab 11/23/16 1359  LIPASE 41   No results for input(s): AMMONIA in the last 168 hours. Coagulation Profile: No results for input(s): INR, PROTIME in the last 168 hours. Cardiac Enzymes: No results for input(s): CKTOTAL, CKMB, CKMBINDEX, TROPONINI in the last 168 hours. BNP (last 3 results) No results for input(s): PROBNP in the last 8760 hours. HbA1C:  Recent Labs  11/23/16 2055  HGBA1C 6.0*   CBG:  Recent Labs Lab 11/25/16 1234 11/25/16 1728 11/25/16 2135 11/26/16 0902 11/26/16 1209  GLUCAP 221* 84 105* 125* 114*   Lipid  Profile: No results for input(s): CHOL, HDL, LDLCALC, TRIG, CHOLHDL, LDLDIRECT in the last 72 hours. Thyroid Function Tests:  Recent Labs  11/23/16 2055  TSH 1.033   Anemia Panel: No results for input(s): VITAMINB12, FOLATE, FERRITIN, TIBC, IRON, RETICCTPCT in the last 72 hours. Urine analysis:    Component Value Date/Time   COLORURINE STRAW (A) 11/23/2016 1523   APPEARANCEUR CLEAR 11/23/2016 1523   LABSPEC 1.001 (L) 11/23/2016 1523   PHURINE 7.0 11/23/2016 1523   GLUCOSEU NEGATIVE 11/23/2016 1523   HGBUR NEGATIVE 11/23/2016 1523   BILIRUBINUR NEGATIVE 11/23/2016 1523   KETONESUR NEGATIVE 11/23/2016 1523   PROTEINUR NEGATIVE 11/23/2016 1523   NITRITE NEGATIVE 11/23/2016 1523   LEUKOCYTESUR TRACE (A) 11/23/2016 1523     Monica Cohen M.D. [facility] 11/26/2016, 1:28 PM  Pager: [phone] Between 7am to 7pm - call Pager - [phone]  After 7pm go to [email or web address] - password [email or web address]  Call night coverage person covering after 7pm

## 2016-11-26 NOTE — Progress Notes (Signed)
Physical Therapy Treatment Patient Details Name: Monica Cohen MRN: [id number] DOB: 1926/02/23 Today's Date: 11/26/2016    History of Present Illness Patient is an 83 year old female with diabetes, hypertension, HOH wearing hearing aid, recently moved to [place] 2 months ago, admitted on 11/23/16 with acute mental status changes.  Patient found to have severe hyponatremia     PT Comments    Patient is making improvements with mobility and gait.  Continue to recommend use of RW at home for safety, and f/u HHPT.   Follow Up Recommendations  Home health PT;Supervision - Intermittent     Equipment Recommendations  Rolling walker with 5" wheels    Recommendations for Other Services       Precautions / Restrictions Precautions Precautions: Fall Precaution Comments: Has had several falls at home.  Per patient, related to lower leg cramping. Restrictions Weight Bearing Restrictions: No    Mobility  Bed Mobility Overal bed mobility: Modified Independent                Transfers Overall transfer level: Needs assistance Equipment used: None Transfers: Sit to/from Stand Sit to Stand: Supervision         General transfer comment: Good balance during transfers, and good static balance in stance.  Ambulation/Gait Ambulation/Gait assistance: Min assist;Min guard Ambulation Distance (Feet): 200 Feet Assistive device: None Gait Pattern/deviations: Step-through pattern;Decreased step length - right;Decreased step length - left;Decreased stride length;Shuffle Gait velocity: decreased Gait velocity interpretation: Below normal speed for age/gender General Gait Details: Patient with slow, slightly unsteady gait with no assistive device.  Staggered x1 requiring min assist to maintain balance.     Stairs Stairs: Yes   Stair Management: One rail Right;Alternating pattern;Step to pattern;Forwards Number of Stairs: 5 (Limited by IV line) General stair comments: Patient able to  negotiate stairs with alternate steps when ascending stairs.  Used step-to pattern on descent for safety.  Difficulty following commands on limiting number of steps.  Wheelchair Mobility    Modified Rankin (Stroke Patients Only)       Balance   Sitting-balance support: No upper extremity supported;Feet supported Sitting balance-Leahy Scale: Good     Standing balance support: No upper extremity supported Standing balance-Leahy Scale: Good                              Cognition Arousal/Alertness: Awake/alert Behavior During Therapy: Anxious;Restless Overall Cognitive Status: No family/caregiver present to determine baseline cognitive functioning                                 General Comments: Patient more calm today, not perseverating on topics.  Continued to have some difficulty following commands - ? HOH.      Exercises      General Comments        Pertinent Vitals/Pain Pain Assessment: Faces Faces Pain Scale: Hurts a little bit Pain Location: Posterior neck Pain Descriptors / Indicators: Sore (States sore from the hospital pillows) Pain Intervention(s): Monitored during session    Home Living                      Prior Function            PT Goals (current goals can now be found in the care plan section) Acute Rehab PT Goals Patient Stated Goal: To be as independent as possible Progress towards PT  goals: Progressing toward goals    Frequency    Min 3X/week      PT Plan Current plan remains appropriate    Co-evaluation              AM-PAC PT "6 Clicks" Daily Activity  Outcome Measure  Difficulty turning over in bed (including adjusting bedclothes, sheets and blankets)?: None Difficulty moving from lying on back to sitting on the side of the bed? : None Difficulty sitting down on and standing up from a chair with arms (e.g., wheelchair, bedside commode, etc,.)?: None Help needed moving to and from a bed  to chair (including a wheelchair)?: A Little Help needed walking in hospital room?: A Little Help needed climbing 3-5 steps with a railing? : A Little 6 Click Score: 21    End of Session Equipment Utilized During Treatment: Gait belt Activity Tolerance: Patient tolerated treatment well Patient left: in chair;with call bell/phone within reach   PT Visit Diagnosis: Unsteadiness on feet (R26.81);Other abnormalities of gait and mobility (R26.89);Repeated falls (R29.6);Muscle weakness (generalized) (M62.81)     Time: 1720-1740 PT Time Calculation (min) (ACUTE ONLY): 20 min  Charges:  $Gait Training: 8-22 mins                    G Codes:       Monica Cohen, Monica Cohen    Monica Cohen 11/26/2016, 7:30 PM

## 2016-11-26 NOTE — Progress Notes (Signed)
Patient asked for the reason why a new IV needed to be started because she is due to be d/c'ed in the morning. Patient's RN was notified about conversation.

## 2016-11-26 NOTE — Progress Notes (Signed)
Patient arrived on the unit via bed from [facility] progressive care. Oriented to room, call light, TV and Phone. IV in RFA with IV fluids running @ 60. No telemetry, 02. Hard of hearing but does not have her hearing aids with her. Will continue to follow patient as needed.

## 2016-11-27 LAB — BASIC METABOLIC PANEL
Anion gap: 7 (ref 5–15)
BUN: 6 mg/dL (ref 6–20)
CHLORIDE: 101 mmol/L (ref 101–111)
CO2: 24 mmol/L (ref 22–32)
Calcium: 9.2 mg/dL (ref 8.9–10.3)
Creatinine, Ser: 0.88 mg/dL (ref 0.44–1.00)
GFR calc non Af Amer: 57 mL/min — ABNORMAL LOW (ref >60)
Glucose, Bld: 93 mg/dL (ref 65–99)
Potassium: 3.9 mmol/L (ref 3.5–5.1)
SODIUM: 132 mmol/L — AB (ref 135–145)

## 2016-11-27 LAB — GLUCOSE, CAPILLARY
GLUCOSE-CAPILLARY: 103 mg/dL — AB (ref 65–99)
GLUCOSE-CAPILLARY: 134 mg/dL — AB (ref 65–99)
GLUCOSE-CAPILLARY: 96 mg/dL (ref 65–99)
Glucose-Capillary: 86 mg/dL (ref 65–99)

## 2016-11-27 MED ORDER — SODIUM CHLORIDE 0.9 % IV SOLN
INTRAVENOUS | Status: DC
  Administered 2016-11-27: 21:00:00 via INTRAVENOUS
  Administered 2016-11-27: 1000 mL via INTRAVENOUS
  Administered 2016-11-28: 06:00:00 via INTRAVENOUS

## 2016-11-27 MED ORDER — ASPIRIN EC 81 MG PO TBEC
81.0000 mg | DELAYED_RELEASE_TABLET | Freq: Every day | ORAL | Status: DC
  Administered 2016-11-27 – 2016-11-28 (×2): 81 mg via ORAL
  Filled 2016-11-27: qty 1

## 2016-11-27 NOTE — Care Management Important Message (Signed)
Important Message  Patient Details  Name: Monica AverSusan Cohen MRN: [id number] Date of Birth: 05-08-1926   Medicare Important Message Given:  Yes    [name] 11/27/2016, 11:40 AM

## 2016-11-27 NOTE — Progress Notes (Signed)
Physical Therapy Treatment Patient Details Name: Monica Cohen MRN: [id number] DOB: 12/15/1925 Today's Date: 11/27/2016    History of Present Illness Patient is an 83 year old female with diabetes, hypertension, HOH wearing hearing aid, recently [place] 2 [place] ago, admitted on 11/23/16 with acute mental status changes.  Patient found to have severe hyponatremia     PT Comments    Pt performed increased gait.  Plan for stair training and issue HEP for home use next visit.  Plan to return home remains appropriate.  Better safety noted with use of RW.     Follow Up Recommendations  Home health PT;Supervision - Intermittent     Equipment Recommendations  Rolling walker with 5" wheels    Recommendations for Other Services       Precautions / Restrictions Precautions Precautions: Fall Precaution Comments: Has had several falls at home.  Per patient, related to lower leg cramping.    Mobility  Bed Mobility Overal bed mobility: Needs Assistance Bed Mobility: Supine to Sit     Supine to sit: Supervision     General bed mobility comments: Supervision for hand placement to sit edge of bed.    Transfers Overall transfer level: Needs assistance Equipment used: None Transfers: Sit to/from Stand Sit to Stand: Supervision         General transfer comment: Good balance during transfers, and good static balance in stance.  Ambulation/Gait Ambulation/Gait assistance: Supervision;Min guard Ambulation Distance (Feet): 300 Feet Assistive device: Rolling walker (2 wheeled) Gait Pattern/deviations: Step-through pattern;Decreased step length - right;Decreased step length - left;Decreased stride length;Shuffle Gait velocity: decreased Gait velocity interpretation: Below normal speed for age/gender General Gait Details: Patient with improved gait speed and no LOB noted.  Better carryover with use of RW.     Stairs            Wheelchair Mobility    Modified Rankin  (Stroke Patients Only)       Balance Overall balance assessment: Needs assistance;History of Falls Sitting-balance support: No upper extremity supported;Feet supported Sitting balance-Leahy Scale: Good     Standing balance support: No upper extremity supported Standing balance-Leahy Scale: Good                              Cognition Arousal/Alertness: Awake/alert Behavior During Therapy: WFL for tasks assessed/performed Overall Cognitive Status: Difficult to assess                                 General Comments: Pt remains calm and [facility].        Exercises      General Comments        Pertinent Vitals/Pain Pain Assessment: No/denies pain    Home Living                      Prior Function            PT Goals (current goals can now be found in the care plan section) Acute Rehab PT Goals Patient Stated Goal: To be as independent as possible Potential to Achieve Goals: Good Progress towards PT goals: Progressing toward goals    Frequency    Min 3X/week      PT Plan Current plan remains appropriate    Co-evaluation              AM-PAC PT "  6 Clicks" Daily Activity  Outcome Measure  Difficulty turning over in bed (including adjusting bedclothes, sheets and blankets)?: None Difficulty moving from lying on back to sitting on the side of the bed? : None Difficulty sitting down on and standing up from a chair with arms (e.g., wheelchair, bedside commode, etc,.)?: None Help needed moving to and from a bed to chair (including a wheelchair)?: A Little Help needed walking in hospital room?: A Little Help needed climbing 3-5 steps with a railing? : A Little 6 Click Score: 21    End of Session Equipment Utilized During Treatment: Gait belt Activity Tolerance: Patient tolerated treatment well Patient left: in chair;with call bell/phone within reach;with chair alarm set Nurse Communication: Mobility  status PT Visit Diagnosis: Unsteadiness on feet (R26.81);Other abnormalities of gait and mobility (R26.89);Repeated falls (R29.6);Muscle weakness (generalized) (M62.81)     Time: 1324-4010 PT Time Calculation (min) (ACUTE ONLY): 13 min  Charges:  $Gait Training: 8-22 mins                    G Codes:       Joycelyn Rua, PTA pager [phone]    Monica Cohen 11/27/2016, 5:27 PM

## 2016-11-27 NOTE — Progress Notes (Signed)
[facility]                                                                              Patient Demographics  Monica Cohen, is a 83 y.o. female, DOB - 02-05-1926, MBP:[id number]  Admit date - 11/23/2016   Admitting Physician Zully Frane Krystal Eaton, MD  Outpatient Primary MD for the patient is Patient, No Pcp Per  Outpatient specialists:   LOS - 4  days   Medical records reviewed and are as summarized below:    Chief Complaint  Patient presents with  . Emesis       Brief summary   Patient is a 83 year old female with diabetes, hypertension, recently moved to [place] 2 months ago presented with acute mental status changes, found to have severe hyponatremia 109. Patient was brought by her neighbor, at baseline, physically active person. On review of the medical chart in the care everywhere, patient had a routine office visit on 5/11 2018 at [facility] when sodium was 142.   Assessment & Plan    Principal Problem: Acute  Hyponatremia with severe hypoosmolar hyponatremia - Serum osm 239, urine osmolarity 59, urine sodium less than 10, rapid correction with 3% suggesting SIADH not the etiology. Patient was started on 3% normal saline in ED, lowered to 25 mL/ hour and changed to 0.9% normal saline after Na corrected from 109 to 114 -Sodium trending up, 132, however she lost her IV access yesterday, subsequently refused IV line hence did not receive any fluid hydration. - Serum osmolarity still low 249, uri osm 117, continue gentle hydration - PT evaluation recommended home health PT. - Met with patient's son at the bedside, visiting from [place], had a long discussion with patient's son who is also worried about patient's living situation. Per son, patient is adamant about living independently. He will discuss with the rest of the family members about assisted living facility. Discussed about medical alert/life alert or having private caregiver for the patient if she  does not want to move to assisted living facility. She does need intermittent supervision at this point.  He is agreeable with home PT, home health aide, RN and DC home in a.m.   Active Problems:   Acute metabolic encephalopathy: Likely due to severe hyponatremia - CT head showed no acute CVA or intracranial abnormality - Likely due to #1, currently alert and oriented 3    Essential hypertension - Currently stable, hold off on outpatient antihypertensives, discontinue thiazide from outpatient antihypertensives - Patient's BP has been normal or on the lower side during the hospitalization, she does not need to be on any antihypertensives upon DC.     Diabetes mellitus ([place]): type 2 - Hemoglobin A1c 6.0, continuous sliding scale insulin and carb modified diet  - She is on glipizide 2.5 mg daily outpatient, continue    Hypokalemia - Resolved   Constipation - Placed on Senokot-S and MiraLAX  Code Status: Full CODE STATUS DVT Prophylaxis:  Lovenox Family Communication: Discussed in detail with the patient, all imaging results, lab results explained to the patient and patient's son at the bedside. I also briefly talked to patient's daughter,  [name] on the phone as well   Disposition Plan: Plan for DC home tomorrow, hopefully sodium >135 , with home PT OT aide and RN   Time Spent in minutes 25 minutes  Procedures:  CT head   Consultants:   Critical care  Antimicrobials :   None    Medications  Scheduled Meds: . aspirin EC  81 mg Oral Daily  . enoxaparin (LOVENOX) injection  30 mg Subcutaneous Q24H  . insulin aspart  0-5 Units Subcutaneous QHS  . insulin aspart  0-9 Units Subcutaneous TID WC  . polyethylene glycol  17 g Oral Daily  . senna-docusate  1 tablet Oral BID   Continuous Infusions: . sodium chloride 1,000 mL (11/27/16 0948)   PRN Meds:.acetaminophen **OR** acetaminophen, ondansetron **OR** ondansetron (ZOFRAN) IV   Antibiotics   Anti-infectives    None          Subjective:   Jacqulyne Gladue was seen and examined today. No complaints per patient, eating well. No nausea or vomiting.  Patient denies dizziness, chest pain, shortness of breath, abdominal pain, new weakness, numbess, tingling. No fevers  Objective:   Vitals:   11/26/16 2222 11/27/16 0445 11/27/16 0740 11/27/16 1400  BP: (!) 145/55 (!) 124/52 (!) 116/56 (!) 122/54  Pulse: 84 80 76 84  Resp:  '16 18 17  ' Temp: 98.9 F (37.2 C) 98.8 F (37.1 C) 98.2 F (36.8 C) 98.8 F (37.1 C)  TempSrc: Oral Oral Oral Oral  SpO2: 99% 100% 100% 100%  Weight:      Height:        Intake/Output Summary (Last 24 hours) at 11/27/16 1458 Last data filed at 11/27/16 1300  Gross per 24 hour  Intake          1111.25 ml  Output              401 ml  Net           710.25 ml     Wt Readings from Last 3 Encounters:  11/26/16 58.6 kg (129 lb 1.6 oz)     Exam  General:Alert, awake, oriented 3, NAD   Eyes: PERRLA, EOMI  HEENT:Normocephalic, atraumatic  Cardiovascular: S1 and S2 clear, no MRG, RRR  Respiratory: CTA B  Gastrointestinal: Soft, nontender, ND, NBS  Ext: no pedal edema bilaterally  Neuro: no FND  Musculoskeletal: No cyanosis, clubbing  Skin: No rashes  Psych:alert and oriented 3, normal affect  Data Reviewed:  I have personally reviewed following labs and imaging studies  Micro Results Recent Results (from the past 240 hour(s))  MRSA PCR Screening     Status: None   Collection Time: 11/24/16  2:51 AM  Result Value Ref Range Status   MRSA by PCR NEGATIVE NEGATIVE Final    Comment:        The GeneXpert MRSA Assay (FDA approved for NASAL specimens only), is one component of a comprehensive MRSA colonization surveillance program. It is not intended to diagnose MRSA infection nor to guide or monitor treatment for MRSA infections.     Radiology Reports Ct Head Wo Contrast  Result Date: 11/23/2016 CLINICAL DATA:  Increased weakness for 5 months with  multiple falls EXAM: CT HEAD WITHOUT CONTRAST TECHNIQUE: Contiguous axial images were obtained from the base of the skull through the vertex without intravenous contrast. COMPARISON:  None. FINDINGS: Brain: No acute territorial infarction, hemorrhage or intracranial mass is seen. Mild periventricular white matter hypodensity likely small vessel ischemic change. Moderate atrophy. Normal ventricle size.  Empty sella. Vascular: No hyperdense vessels.  Carotid artery calcifications. Skull: Normal. Negative for fracture or focal lesion. Sinuses/Orbits: Mild mucosal thickening in the ethmoid sinuses. No acute orbital abnormality. Bilateral lens extraction. Other: None IMPRESSION: 1. No CT evidence for acute intracranial abnormality. 2. Small vessel ischemic changes of the white matter and atrophy. Electronically Signed   By: Donavan Foil M.D.   On: 11/23/2016 18:08   Dg Chest Port 1 View  Result Date: 11/23/2016 CLINICAL DATA:  83 year old female with weakness, back pain and hypertension EXAM: PORTABLE CHEST 1 VIEW COMPARISON:  None. FINDINGS: Cardiac and mediastinal contours are within normal limits. Atherosclerotic calcifications are present in the transverse aorta. No focal airspace consolidation, pleural effusion, pneumothorax or pulmonary edema. Calcifications project over the mitral annulus. The osseous structures are intact and unremarkable. IMPRESSION: 1. No acute cardiopulmonary process. 2.  Aortic Atherosclerosis (ICD10-170.0) 3. Calcifications of the mitral valve annulus. Electronically Signed   By: Jacqulynn Cadet M.D.   On: 11/23/2016 16:11    Lab Data:  CBC:  Recent Labs Lab 11/23/16 1359 11/24/16 0305  WBC 2.6* 3.4*  HGB 12.6 13.1  HCT 34.6* 36.0  MCV 77.2* 77.3*  PLT 236 093   Basic Metabolic Panel:  Recent Labs Lab 11/25/16 0202 11/25/16 1108 11/25/16 1614 11/26/16 0154 11/27/16 0412  NA 129* 128* 129* 132* 132*  K 3.8 3.8 3.8 3.9 3.9  CL 98* 96* 98* 101 101  CO2 21* '25  25 22 24  ' GLUCOSE 72 203* 95 86 93  BUN <5* <5* 6 <5* 6  CREATININE 0.87 1.10* 1.01* 0.88 0.88  CALCIUM 9.2 9.2 9.1 9.1 9.2   GFR: Estimated Creatinine Clearance: 34.5 mL/min (by C-G formula based on SCr of 0.88 mg/dL). Liver Function Tests:  Recent Labs Lab 11/23/16 1359  AST 33  ALT 21  ALKPHOS 49  BILITOT 1.5*  PROT 6.5  ALBUMIN 3.9    Recent Labs Lab 11/23/16 1359  LIPASE 41   No results for input(s): AMMONIA in the last 168 hours. Coagulation Profile: No results for input(s): INR, PROTIME in the last 168 hours. Cardiac Enzymes: No results for input(s): CKTOTAL, CKMB, CKMBINDEX, TROPONINI in the last 168 hours. BNP (last 3 results) No results for input(s): PROBNP in the last 8760 hours. HbA1C: No results for input(s): HGBA1C in the last 72 hours. CBG:  Recent Labs Lab 11/26/16 1209 11/26/16 1711 11/26/16 2152 11/27/16 0803 11/27/16 1208  GLUCAP 114* 113* 108* 103* 134*   Lipid Profile: No results for input(s): CHOL, HDL, LDLCALC, TRIG, CHOLHDL, LDLDIRECT in the last 72 hours. Thyroid Function Tests: No results for input(s): TSH, T4TOTAL, FREET4, T3FREE, THYROIDAB in the last 72 hours. Anemia Panel: No results for input(s): VITAMINB12, FOLATE, FERRITIN, TIBC, IRON, RETICCTPCT in the last 72 hours. Urine analysis:    Component Value Date/Time   COLORURINE STRAW (A) 11/23/2016 1523   APPEARANCEUR CLEAR 11/23/2016 1523   LABSPEC 1.001 (L) 11/23/2016 1523   PHURINE 7.0 11/23/2016 1523   GLUCOSEU NEGATIVE 11/23/2016 1523   HGBUR NEGATIVE 11/23/2016 1523   BILIRUBINUR NEGATIVE 11/23/2016 1523   KETONESUR NEGATIVE 11/23/2016 1523   PROTEINUR NEGATIVE 11/23/2016 1523   NITRITE NEGATIVE 11/23/2016 1523   LEUKOCYTESUR TRACE (A) 11/23/2016 1523     Jamayia Croker M.D. [facility] 11/27/2016, 2:58 PM  Pager: [phone] Between 7am to 7pm - call Pager - [phone]  After 7pm go to [email or web address] - password [email or web address]  Call night coverage person covering  after 7pm

## 2016-11-27 NOTE — Progress Notes (Signed)
IV leaking, removed.  Two nurses tried to reinsert without success.  NP paged to see is IV necessary due to patient due to be discharged in a.m., awaiting response.  Will continue to monitor and notify as needed. P.J. Henderson NewcomerSexton, RN

## 2016-11-27 NOTE — Progress Notes (Signed)
Order received to leave IV out until morning, IV fluids discontinued.  P.J. Henderson NewcomerSexton, RN

## 2016-11-28 LAB — BASIC METABOLIC PANEL
Anion gap: 9 (ref 5–15)
BUN: 7 mg/dL (ref 6–20)
CALCIUM: 8.5 mg/dL — AB (ref 8.9–10.3)
CHLORIDE: 104 mmol/L (ref 101–111)
CO2: 23 mmol/L (ref 22–32)
CREATININE: 0.88 mg/dL (ref 0.44–1.00)
GFR calc Af Amer: >60 mL/min (ref >60)
GFR calc non Af Amer: 57 mL/min — ABNORMAL LOW (ref >60)
GLUCOSE: 88 mg/dL (ref 65–99)
Potassium: 4.1 mmol/L (ref 3.5–5.1)
Sodium: 136 mmol/L (ref 135–145)

## 2016-11-28 LAB — GLUCOSE, CAPILLARY
Glucose-Capillary: 124 mg/dL — ABNORMAL HIGH (ref 65–99)
Glucose-Capillary: 158 mg/dL — ABNORMAL HIGH (ref 65–99)

## 2016-11-28 MED ORDER — LISINOPRIL 10 MG PO TABS
10.0000 mg | ORAL_TABLET | Freq: Every day | ORAL | 0 refills | Status: DC
Start: 2016-11-28 — End: 2017-02-22

## 2016-11-28 NOTE — Progress Notes (Signed)
Monica AverSusan Cohen to be D/C'd Home per MD order.  Discussed with the patient and all questions fully answered.  VSS, Skin clean, dry and intact without evidence of skin break down, no evidence of skin tears noted. IV catheter discontinued intact. Site without signs and symptoms of complications. Dressing and pressure applied.  An After Visit Summary was printed and given to the patient. Patient received prescription.  D/c education completed with patient/family including follow up instructions, medication list, d/c activities limitations if indicated, with other d/c instructions as indicated by MD - patient able to verbalize understanding, all questions fully answered.   Patient instructed to return to ED, call 911, or call MD for any changes in condition.   Patient escorted via WC, and D/C home via private auto.  [name]  [name] 11/28/2016 3:00 PM

## 2016-11-28 NOTE — Progress Notes (Signed)
Patient valuables returned, and patient signed, receipt in the chart.

## 2016-11-28 NOTE — Discharge Summary (Signed)
Physician Discharge Summary  Cecille AverSusan Widger ZOX:[id number] DOB: Feb 10, 1926 DOA: 11/23/2016  PCP: Monica Cohen, No Pcp Per  Admit date: 11/23/2016 Discharge date: 11/28/2016  Time spent: 35 minutes  Recommendations for Outpatient Follow-up:  1. PCP in 1 week 2. Home health PT/OT/Aide   Discharge Diagnoses:  Principal Problem:   Hyponatremia Active Problems:   Acute metabolic encephalopathy   Essential hypertension   Diabetes mellitus (HCC)   Hypokalemia   Discharge Condition: stable  Diet recommendation: diabetic  Filed Weights   11/23/16 2030 11/26/16 1110  Weight: 57.6 kg (126 lb 15.8 oz) 58.6 kg (129 lb 1.6 oz)    History of present illness:  Monica Cohen is a 83 year old female with diabetes, hypertension, [place] 2 [place] ago presented with acute mental status changes, found to have severe hyponatremia 109  Hospital Course:  Acute  Hyponatremia with severe hypoosmolar hyponatremia - Admitted with severe symptomatic hyponatremia due to dehydration and HCTZ -treated with 3% saline initially -improved, adequately hydrated and HCTZ stopped -corrected to 136 at discharge - PT evaluation recommended home health PT. -discharged home with family with HH PT, home health aide, RN     Acute metabolic encephalopathy: Likely due to severe hyponatremia - CT head showed no acute CVA or intracranial abnormality - due to 1, resolved    Essential hypertension - BP low initially and subsequently trending up, HCTZ stopped due to above and restarted low dose lisinopril     Diabetes mellitus (HCC): type 2 - Hemoglobin A1c 6.0, stable - She is on glipizide 2.5 mg daily outpatient, continued    Hypokalemia - Resolved   Constipation - Placed on Senokot-S and MiraLAX  Discharge Exam: Vitals:   11/27/16 2125 11/28/16 0617  BP: (!) 141/53 (!) 145/59  Pulse: 74 78  Resp: 18 18  Temp: 99.3 F (37.4 C) 98.8 F (37.1 C)    General: AAOx3 Cardiovascular:  S1S2/RRR Respiratory: CTAB  Discharge Instructions   Discharge Instructions    Diet - low sodium heart healthy    Complete by:  As directed    Diet Carb Modified    Complete by:  As directed    Increase activity slowly    Complete by:  As directed      Discharge Medication List as of 11/28/2016  1:14 PM    START taking these medications   Details  lisinopril (PRINIVIL,ZESTRIL) 10 MG tablet Take 1 tablet (10 mg total) by mouth daily., Starting Wed 11/28/2016, Print      CONTINUE these medications which have NOT CHANGED   Details  aspirin 325 MG tablet Take 325 mg by mouth daily., Historical Med    glipiZIDE (GLUCOTROL) 5 MG tablet Take 0.5 tablets (2.5 mg total) by mouth daily before breakfast., Starting Thu 07/26/2016, Print      STOP taking these medications     lisinopril-hydrochlorothiazide (PRINZIDE,ZESTORETIC) 20-25 MG tablet      meloxicam (MOBIC) 7.5 MG tablet        No Known Allergies Follow-up Information    PCP. Schedule an appointment as soon as possible for a visit in 1 week(s).        [facility].   Why:  home health services arranged , office will call and set up home visits Contact information: [address] [place] [id number] [phone]            The results of significant diagnostics from this hospitalization (including imaging, microbiology, ancillary and laboratory) are listed below  for reference.    Significant Diagnostic Studies: Ct Head Wo Contrast  Result Date: 11/23/2016 CLINICAL DATA:  Increased weakness for 5 months with multiple falls EXAM: CT HEAD WITHOUT CONTRAST TECHNIQUE: Contiguous axial images were obtained from the base of the skull through the vertex without intravenous contrast. COMPARISON:  None. FINDINGS: Brain: No acute territorial infarction, hemorrhage or intracranial mass is seen. Mild periventricular white matter hypodensity likely small vessel ischemic change. Moderate  atrophy. Normal ventricle size. Empty sella. Vascular: No hyperdense vessels.  Carotid artery calcifications. Skull: Normal. Negative for fracture or focal lesion. Sinuses/Orbits: Mild mucosal thickening in the ethmoid sinuses. No acute orbital abnormality. Bilateral lens extraction. Other: None IMPRESSION: 1. No CT evidence for acute intracranial abnormality. 2. Small vessel ischemic changes of the white matter and atrophy. Electronically Signed   By: Jasmine Pang M.D.   On: 11/23/2016 18:08   Dg Chest Port 1 View  Result Date: 11/23/2016 CLINICAL DATA:  83 year old female with weakness, back pain and hypertension EXAM: PORTABLE CHEST 1 VIEW COMPARISON:  None. FINDINGS: Cardiac and mediastinal contours are within normal limits. Atherosclerotic calcifications are present in the transverse aorta. No focal airspace consolidation, pleural effusion, pneumothorax or pulmonary edema. Calcifications project over the mitral annulus. The osseous structures are intact and unremarkable. IMPRESSION: 1. No acute cardiopulmonary process. 2.  Aortic Atherosclerosis (ICD10-170.0) 3. Calcifications of the mitral valve annulus. Electronically Signed   By: Malachy Moan M.D.   On: 11/23/2016 16:11    Microbiology: Recent Results (from the past 240 hour(s))  MRSA PCR Screening     Status: None   Collection Time: 11/24/16  2:51 AM  Result Value Ref Range Status   MRSA by PCR NEGATIVE NEGATIVE Final    Comment:        The GeneXpert MRSA Assay (FDA approved for NASAL specimens only), is one component of a comprehensive MRSA colonization surveillance program. It is not intended to diagnose MRSA infection nor to guide or monitor treatment for MRSA infections.      Labs: Basic Metabolic Panel:  Recent Labs Lab 11/25/16 1108 11/25/16 1614 11/26/16 0154 11/27/16 0412 11/28/16 0436  NA 128* 129* 132* 132* 136  K 3.8 3.8 3.9 3.9 4.1  CL 96* 98* 101 101 104  CO2 25 25 22 24 23   GLUCOSE 203* 95 86 93 88   BUN <5* 6 <5* 6 7  CREATININE 1.10* 1.01* 0.88 0.88 0.88  CALCIUM 9.2 9.1 9.1 9.2 8.5*   Liver Function Tests:  Recent Labs Lab 11/23/16 1359  AST 33  ALT 21  ALKPHOS 49  BILITOT 1.5*  PROT 6.5  ALBUMIN 3.9    Recent Labs Lab 11/23/16 1359  LIPASE 41   No results for input(s): AMMONIA in the last 168 hours. CBC:  Recent Labs Lab 11/23/16 1359 11/24/16 0305  WBC 2.6* 3.4*  HGB 12.6 13.1  HCT 34.6* 36.0  MCV 77.2* 77.3*  PLT 236 212   Cardiac Enzymes: No results for input(s): CKTOTAL, CKMB, CKMBINDEX, TROPONINI in the last 168 hours. BNP: BNP (last 3 results) No results for input(s): BNP in the last 8760 hours.  ProBNP (last 3 results) No results for input(s): PROBNP in the last 8760 hours.  CBG:  Recent Labs Lab 11/27/16 1208 11/27/16 1708 11/27/16 2123 11/28/16 0756 11/28/16 1148  GLUCAP 134* 86 96 124* 158*       Signed:  Yariela Tison MD.  [facility] 11/28/2016, 3:34 PM

## 2016-11-30 DIAGNOSIS — I10 Essential (primary) hypertension: Secondary | ICD-10-CM | POA: Diagnosis not present

## 2016-11-30 DIAGNOSIS — E119 Type 2 diabetes mellitus without complications: Secondary | ICD-10-CM | POA: Diagnosis not present

## 2016-12-01 LAB — OSMOLALITY: Osmolality: 249 mOsm/kg — CL (ref 275–295)

## 2016-12-03 DIAGNOSIS — I10 Essential (primary) hypertension: Secondary | ICD-10-CM | POA: Diagnosis not present

## 2016-12-03 DIAGNOSIS — E119 Type 2 diabetes mellitus without complications: Secondary | ICD-10-CM | POA: Diagnosis not present

## 2016-12-04 DIAGNOSIS — I10 Essential (primary) hypertension: Secondary | ICD-10-CM | POA: Diagnosis not present

## 2016-12-04 DIAGNOSIS — E119 Type 2 diabetes mellitus without complications: Secondary | ICD-10-CM | POA: Diagnosis not present

## 2016-12-06 DIAGNOSIS — I10 Essential (primary) hypertension: Secondary | ICD-10-CM | POA: Diagnosis not present

## 2016-12-06 DIAGNOSIS — E119 Type 2 diabetes mellitus without complications: Secondary | ICD-10-CM | POA: Diagnosis not present

## 2016-12-07 DIAGNOSIS — I10 Essential (primary) hypertension: Secondary | ICD-10-CM | POA: Diagnosis not present

## 2016-12-07 DIAGNOSIS — E119 Type 2 diabetes mellitus without complications: Secondary | ICD-10-CM | POA: Diagnosis not present

## 2016-12-10 DIAGNOSIS — I10 Essential (primary) hypertension: Secondary | ICD-10-CM | POA: Diagnosis not present

## 2016-12-10 DIAGNOSIS — E119 Type 2 diabetes mellitus without complications: Secondary | ICD-10-CM | POA: Diagnosis not present

## 2016-12-11 DIAGNOSIS — H11153 Pinguecula, bilateral: Secondary | ICD-10-CM | POA: Diagnosis not present

## 2016-12-11 DIAGNOSIS — E119 Type 2 diabetes mellitus without complications: Secondary | ICD-10-CM | POA: Diagnosis not present

## 2016-12-11 DIAGNOSIS — Z961 Presence of intraocular lens: Secondary | ICD-10-CM | POA: Diagnosis not present

## 2016-12-11 DIAGNOSIS — I10 Essential (primary) hypertension: Secondary | ICD-10-CM | POA: Diagnosis not present

## 2016-12-13 DIAGNOSIS — E119 Type 2 diabetes mellitus without complications: Secondary | ICD-10-CM | POA: Diagnosis not present

## 2016-12-13 DIAGNOSIS — I10 Essential (primary) hypertension: Secondary | ICD-10-CM | POA: Diagnosis not present

## 2016-12-14 DIAGNOSIS — I10 Essential (primary) hypertension: Secondary | ICD-10-CM | POA: Diagnosis not present

## 2016-12-14 DIAGNOSIS — E119 Type 2 diabetes mellitus without complications: Secondary | ICD-10-CM | POA: Diagnosis not present

## 2016-12-20 DIAGNOSIS — E119 Type 2 diabetes mellitus without complications: Secondary | ICD-10-CM | POA: Diagnosis not present

## 2016-12-20 DIAGNOSIS — I10 Essential (primary) hypertension: Secondary | ICD-10-CM | POA: Diagnosis not present

## 2016-12-21 DIAGNOSIS — E119 Type 2 diabetes mellitus without complications: Secondary | ICD-10-CM | POA: Diagnosis not present

## 2016-12-21 DIAGNOSIS — I10 Essential (primary) hypertension: Secondary | ICD-10-CM | POA: Diagnosis not present

## 2016-12-24 DIAGNOSIS — I10 Essential (primary) hypertension: Secondary | ICD-10-CM | POA: Diagnosis not present

## 2016-12-24 DIAGNOSIS — E119 Type 2 diabetes mellitus without complications: Secondary | ICD-10-CM | POA: Diagnosis not present

## 2017-02-12 ENCOUNTER — Emergency Department (HOSPITAL_COMMUNITY): Payer: Medicare Other

## 2017-02-12 ENCOUNTER — Encounter (HOSPITAL_COMMUNITY): Payer: Self-pay

## 2017-02-12 ENCOUNTER — Emergency Department (HOSPITAL_COMMUNITY)
Admission: EM | Admit: 2017-02-12 | Discharge: 2017-02-12 | Disposition: A | Discharge status: Home / Self Care | Payer: Medicare Other | Attending: Emergency Medicine | Admitting: Emergency Medicine

## 2017-02-12 DIAGNOSIS — I10 Essential (primary) hypertension: Secondary | ICD-10-CM | POA: Insufficient documentation

## 2017-02-12 DIAGNOSIS — N3000 Acute cystitis without hematuria: Secondary | ICD-10-CM

## 2017-02-12 DIAGNOSIS — Z7984 Long term (current) use of oral hypoglycemic drugs: Secondary | ICD-10-CM | POA: Insufficient documentation

## 2017-02-12 DIAGNOSIS — E119 Type 2 diabetes mellitus without complications: Secondary | ICD-10-CM | POA: Insufficient documentation

## 2017-02-12 DIAGNOSIS — M545 Low back pain: Secondary | ICD-10-CM | POA: Diagnosis not present

## 2017-02-12 DIAGNOSIS — M25551 Pain in right hip: Secondary | ICD-10-CM | POA: Diagnosis present

## 2017-02-12 DIAGNOSIS — Z7982 Long term (current) use of aspirin: Secondary | ICD-10-CM | POA: Insufficient documentation

## 2017-02-12 DIAGNOSIS — M5417 Radiculopathy, lumbosacral region: Secondary | ICD-10-CM | POA: Diagnosis not present

## 2017-02-12 DIAGNOSIS — M1611 Unilateral primary osteoarthritis, right hip: Secondary | ICD-10-CM | POA: Diagnosis not present

## 2017-02-12 DIAGNOSIS — B9689 Other specified bacterial agents as the cause of diseases classified elsewhere: Secondary | ICD-10-CM | POA: Diagnosis not present

## 2017-02-12 LAB — URINALYSIS, ROUTINE W REFLEX MICROSCOPIC
BILIRUBIN URINE: NEGATIVE
GLUCOSE, UA: NEGATIVE mg/dL
Hgb urine dipstick: NEGATIVE
Ketones, ur: NEGATIVE mg/dL
NITRITE: NEGATIVE
PH: 6 (ref 5.0–8.0)
Protein, ur: 30 mg/dL — AB
SPECIFIC GRAVITY, URINE: 1.017 (ref 1.005–1.030)

## 2017-02-12 LAB — I-STAT CHEM 8, ED
BUN: 21 mg/dL — ABNORMAL HIGH (ref 6–20)
CHLORIDE: 104 mmol/L (ref 101–111)
Calcium, Ion: 1.22 mmol/L (ref 1.15–1.40)
Creatinine, Ser: 1 mg/dL (ref 0.44–1.00)
GLUCOSE: 92 mg/dL (ref 65–99)
HEMATOCRIT: 45 % (ref 36.0–46.0)
HEMOGLOBIN: 15.3 g/dL — AB (ref 12.0–15.0)
POTASSIUM: 4.3 mmol/L (ref 3.5–5.1)
SODIUM: 143 mmol/L (ref 135–145)
TCO2: 28 mmol/L (ref 22–32)

## 2017-02-12 LAB — CBC WITH DIFFERENTIAL/PLATELET
BASOS ABS: 0 10*3/uL (ref 0.0–0.1)
BASOS PCT: 0 %
EOS ABS: 0.1 10*3/uL (ref 0.0–0.7)
EOS PCT: 1 %
HCT: 42.2 % (ref 36.0–46.0)
Hemoglobin: 13.9 g/dL (ref 12.0–15.0)
LYMPHS PCT: 35 %
Lymphs Abs: 1.8 10*3/uL (ref 0.7–4.0)
MCH: 28.4 pg (ref 26.0–34.0)
MCHC: 32.9 g/dL (ref 30.0–36.0)
MCV: 86.3 fL (ref 78.0–100.0)
MONO ABS: 0.5 10*3/uL (ref 0.1–1.0)
Monocytes Relative: 9 %
Neutro Abs: 2.9 10*3/uL (ref 1.7–7.7)
Neutrophils Relative %: 55 %
Platelets: 215 10*3/uL (ref 150–400)
RBC: 4.89 MIL/uL (ref 3.87–5.11)
RDW: 14.2 % (ref 11.5–15.5)
WBC: 5.2 10*3/uL (ref 4.0–10.5)

## 2017-02-12 MED ORDER — CEPHALEXIN 500 MG PO CAPS: 500.0000 mg | ORAL_CAPSULE | Freq: Two times a day (BID) | ORAL | 0 refills | Status: DC

## 2017-02-12 MED ORDER — HYDROCODONE-ACETAMINOPHEN 5-325 MG PO TABS
1.0000 | ORAL_TABLET | Freq: Once | ORAL | Status: AC
  Administered 2017-02-12: 1 via ORAL
  Filled 2017-02-12: qty 1

## 2017-02-12 MED ORDER — CEPHALEXIN 250 MG PO CAPS
500.0000 mg | ORAL_CAPSULE | Freq: Once | ORAL | Status: AC
  Administered 2017-02-12: 500 mg via ORAL
  Filled 2017-02-12: qty 2

## 2017-02-12 MED ORDER — DEXAMETHASONE SODIUM PHOSPHATE 4 MG/ML IJ SOLN
4.0000 mg | Freq: Once | INTRAMUSCULAR | Status: AC
  Administered 2017-02-12: 4 mg via INTRAVENOUS
  Filled 2017-02-12: qty 1

## 2017-02-12 MED ORDER — KETOROLAC TROMETHAMINE 15 MG/ML IJ SOLN
15.0000 mg | Freq: Once | INTRAMUSCULAR | Status: AC
  Administered 2017-02-12: 15 mg via INTRAVENOUS
  Filled 2017-02-12: qty 1

## 2017-02-12 MED ORDER — HYDROCODONE-ACETAMINOPHEN 5-325 MG PO TABS: 0.5000 | ORAL_TABLET | Freq: Four times a day (QID) | ORAL | 0 refills | Status: DC | PRN

## 2017-02-12 NOTE — ED Provider Notes (Signed)
[facility]EMERGENCY DEPT Provider Note   CSN: [id number] Arrival date & time: 02/12/17  1044     History   Chief Complaint No chief complaint on file.   HPI Monica Cohen is a 83 y.o. female.  HPI Monica Cohen is a 83 y.o. female with history of hypertension diabetes, presents to emergency department complaining of pain to the right hip and right leg. Patient states she was sitting down in a chair yesterday morning when she felt sharp pain stabbing in her right hip. She states since then she hasn't been able to walk. She states she has been crawling to the bathroom. She lives at home alone and her neighbor has been checking on her and today when she still hasn't been able to walk was brought to emergency department. She took Tylenol for pain which did not help. She denies any similar complaints in the past. She states she did fall about a month ago but was not hurting until yesterday. She reports pain shoots from the right hip into the right thigh and states she feels tingling in the right great toe. Denies fever or chills. No urinary symptom. No other complaints.    Past Medical History:  Diagnosis Date  . Diabetes mellitus without complication (HCC)   . Hypertension     Patient Active Problem List   Diagnosis Date Noted  . Hyponatremia 11/23/2016  . Acute metabolic encephalopathy 11/23/2016  . Essential hypertension 11/23/2016  . Diabetes mellitus (HCC) 11/23/2016  . Hypokalemia 11/23/2016    History reviewed. No pertinent surgical history.  OB History    No data available       Home Medications    Prior to Admission medications   Medication Sig Start Date End Date Taking? Authorizing Provider  acetaminophen (TYLENOL) 325 MG tablet Take 650 mg by mouth every 6 (six) hours as needed for mild pain.   Yes [provider]  aspirin 325 MG tablet Take 325 mg by mouth daily.   Yes [provider]  glipiZIDE (GLUCOTROL) 5 MG tablet Take 0.5 tablets (2.5 mg total)  by mouth daily before breakfast. 07/26/16  Yes Dorena Bodo, NP  lisinopril (PRINIVIL,ZESTRIL) 10 MG tablet Take 1 tablet (10 mg total) by mouth daily. 11/28/16  Yes Zannie Cove, MD    Family History No family history on file.  Social History Social History  Substance Use Topics  . Smoking status: Never Smoker  . Smokeless tobacco: Never Used  . Alcohol use No     Allergies   Patient has no known allergies.   Review of Systems Review of Systems  Constitutional: Negative for chills and fever.  Respiratory: Negative for cough, chest tightness and shortness of breath.   Cardiovascular: Negative for chest pain, palpitations and leg swelling.  Gastrointestinal: Negative for abdominal pain, diarrhea, nausea and vomiting.  Genitourinary: Negative for dysuria, flank pain, pelvic pain, vaginal bleeding, vaginal discharge and vaginal pain.  Musculoskeletal: Positive for arthralgias, back pain and myalgias. Negative for neck pain and neck stiffness.  Skin: Negative for rash.  Neurological: Positive for numbness. Negative for dizziness, weakness and headaches.  All other systems reviewed and are negative.    Physical Exam Updated Vital Signs BP (!) 140/93   Pulse 73   Temp 98.1 F (36.7 C) (Oral)   Resp 20   SpO2 100%   Physical Exam  Constitutional: She appears well-developed and well-nourished. No distress.  HENT:  Head: Normocephalic.  Eyes: Conjunctivae are normal.  Neck: Neck supple.  Cardiovascular:  Normal rate, regular rhythm and normal heart sounds.   Pulmonary/Chest: Effort normal and breath sounds normal. No respiratory distress. She has no wheezes. She has no rales.  Abdominal: Soft. Bowel sounds are normal. She exhibits no distension. There is no tenderness. There is no rebound.  Musculoskeletal: She exhibits no edema.  Full range of motion about the hip. Pain with full flexion. No pain with internal/external rotation.  Neurological: She is alert.  5 out  of 5 and equal strength bilaterally in lower extremities. Decreased sensation over right lateral thigh and dorsal foot as well as dorsal great toe. Patellar reflexes 1+ bilaterally.  Skin: Skin is warm and dry.  Psychiatric: She has a normal mood and affect. Her behavior is normal.  Nursing note and vitals reviewed.    ED Treatments / Results  Labs (all labs ordered are listed, but only abnormal results are displayed) Labs Reviewed  URINALYSIS, ROUTINE W REFLEX MICROSCOPIC - Abnormal; Notable for the following:       Result Value   APPearance HAZY (*)    Protein, ur 30 (*)    Leukocytes, UA SMALL (*)    Bacteria, UA MANY (*)    Squamous Epithelial / LPF 0-5 (*)    All other components within normal limits  I-STAT CHEM 8, ED - Abnormal; Notable for the following:    BUN 21 (*)    Hemoglobin 15.3 (*)    All other components within normal limits  URINE CULTURE  CBC WITH DIFFERENTIAL/PLATELET    EKG  EKG Interpretation None       Radiology Dg Hip Unilat  With Pelvis 2-3 Views Right  Result Date: 02/12/2017 CLINICAL DATA:  Right hip pain beginning yesterday. No known injury. EXAM: DG HIP (WITH OR WITHOUT PELVIS) 2-3V RIGHT COMPARISON:  None. FINDINGS: There is no evidence of hip fracture or dislocation. There is no evidence of arthropathy or other focal bone abnormality involving the hip. Degenerative spurring and sclerosis is seen involving the pubic symphysis and right sacroiliac joint. Lower lumbar spine degenerative changes also seen. IMPRESSION: No acute findings. Degenerative changes involving pubic symphysis, right sacroiliac joint, and lower lumbar spine. Electronically Signed   By: Myles Rosenthal M.D.   On: 02/12/2017 11:29    Procedures Procedures (including critical care time)  Medications Ordered in ED Medications  HYDROcodone-acetaminophen (NORCO/VICODIN) 5-325 MG per tablet 1 tablet (not administered)  ketorolac (TORADOL) 15 MG/ML injection 15 mg (not administered)   dexamethasone (DECADRON) injection 4 mg (not administered)     Initial Impression / Assessment and Plan / ED Course  I have reviewed the triage vital signs and the nursing notes.  Pertinent labs & imaging results that were available during my care of the patient were reviewed by me and considered in my medical decision making (see chart for details).     Patient in emergency department with with right hip and leg pain. Exam is most consistent with lumbar sacral oral radiculopathy. Her x-rays of the hip or negative other than degenerative changes. Will add lumbar films. Will had pain medications, basic labs, urinalysis.   Patient feels much better after pain medications. Her labs look normal. She was able to ambulate with no difficulty after pain medications. She states her pain is almost all resolved. Urinalysis still pending.   8:36 PM Urinalysis shows infection. Urine cultures added. All start on Keflex. No evidence of sepsis. Patient is comfortable with going home. She will follow-up with family doctor as soon as possible. First  dose of antibiotics given in emergency department. Return precautions discussed.   Vitals:   02/12/17 1500 02/12/17 1530 02/12/17 1600 02/12/17 1615  BP: (!) 158/62 (!) 160/64 138/72 (!) 152/70  Pulse: 65 78 69 70  Resp:      Temp:      TempSrc:      SpO2: 100% 100% 100% 100%     Final Clinical Impressions(s) / ED Diagnoses   Final diagnoses:  Acute cystitis without hematuria  Lumbosacral radiculopathy    New Prescriptions New Prescriptions   No medications on file     Jaynie Crumble, PA-C 02/13/17 3845    Shaune Pollack, MD 02/13/17 [phone]

## 2017-02-12 NOTE — ED Provider Notes (Signed)
Medical screening examination/treatment/procedure(s) were conducted as a shared visit with non-physician practitioner(s) and myself.  I personally evaluated the patient during the encounter. Briefly, the patient is a 83 year old female here with right lower back and hip pain radiating down the back of her leg to her big toe. Pain and history is consistent with acute sciatica. My main concern at this time is that she lives alone and will need pain control, as well as must be able to ambulate. Plain films of the hip show no fracture but I suspect her pain is more so localized to lumbar radiculopathy. Will obtain plain films of the lumbar spine, give analgesia, and reassess. Will give steroids. The patient does have a history of diabetes but last A1c was 6 and I feel the benefits outweigh the risks. Family updated and in agreement..   Patient ambulatory w/o difficulty after analgesia. UTI noted but do not feel this is contributing to her current pain. Based on shared decision making, pt would like to go home which seems reasonable. Will d/c with outpt follow-up   EKG Interpretation None          Shaune Pollack, MD 02/13/17 [phone]

## 2017-02-12 NOTE — ED Triage Notes (Signed)
Patient complains of severe pain to right hip since am. Denies injury but complains of pain with any ROM and increased pain with standing

## 2017-02-12 NOTE — ED Notes (Signed)
Pt ambulatory with steady gait. Walked to restroom. Denies pain.

## 2017-02-12 NOTE — Discharge Instructions (Signed)
Take keflex as prescribed until all gone for urinary tract infection. Take norco half a tabled or a full one for severe pain. Please follow up with family doctor as soon as able for recheck and further treatment.

## 2017-02-15 LAB — URINE CULTURE: Culture: >=100000 — AB

## 2017-02-16 ENCOUNTER — Telehealth: Payer: Self-pay

## 2017-02-16 NOTE — Telephone Encounter (Signed)
Post ED Visit - Positive Culture Follow-up  Culture report reviewed by antimicrobial stewardship pharmacist:  []  Enzo BiNathan Batchelder, Pharm.D. []  Celedonio MiyamotoJeremy Frens, [address] BoulevardPharm.D., BCPS AQ-ID []  Garvin FilaMike Maccia, Pharm.D., BCPS []  Georgina PillionElizabeth Martin, Pharm.D., BCPS []  GalienMinh Pham, [place]arm[place]D., BCPS, AAHIVP []  Estella HuskMichelle Turner, Pharm.D., BCPS, AAHIVP []  Lysle Pearlachel Rumbarger, PharmD, BCPS []  Casilda Carlsaylor Stone, PharmD, BCPS []  Pollyann SamplesAndy Johnston, PharmD, BCPS [facility] Center[facility] Manheril Pharm D Positive urine culture Treated with Cephalexin, organism sensitive to the same and no further patient follow-up is required at this time.  Jerry CarasCullom, Arlanda Shiplett Burnett 02/16/2017, 11:32 AM

## 2017-02-22 ENCOUNTER — Emergency Department (HOSPITAL_COMMUNITY)
Admission: EM | Admit: 2017-02-22 | Discharge: 2017-02-22 | Disposition: A | Discharge status: Home / Self Care | Payer: Medicare Other | Attending: Emergency Medicine | Admitting: Emergency Medicine

## 2017-02-22 ENCOUNTER — Encounter (HOSPITAL_COMMUNITY): Payer: Self-pay | Admitting: *Deleted

## 2017-02-22 DIAGNOSIS — Z79899 Other long term (current) drug therapy: Secondary | ICD-10-CM | POA: Diagnosis not present

## 2017-02-22 DIAGNOSIS — E119 Type 2 diabetes mellitus without complications: Secondary | ICD-10-CM | POA: Insufficient documentation

## 2017-02-22 DIAGNOSIS — Z7982 Long term (current) use of aspirin: Secondary | ICD-10-CM | POA: Diagnosis not present

## 2017-02-22 DIAGNOSIS — M5431 Sciatica, right side: Secondary | ICD-10-CM

## 2017-02-22 DIAGNOSIS — I10 Essential (primary) hypertension: Secondary | ICD-10-CM | POA: Insufficient documentation

## 2017-02-22 DIAGNOSIS — M545 Low back pain: Secondary | ICD-10-CM | POA: Diagnosis present

## 2017-02-22 DIAGNOSIS — Z7984 Long term (current) use of oral hypoglycemic drugs: Secondary | ICD-10-CM | POA: Diagnosis not present

## 2017-02-22 LAB — CBG MONITORING, ED: GLUCOSE-CAPILLARY: 88 mg/dL (ref 65–99)

## 2017-02-22 MED ORDER — PREDNISONE 20 MG PO TABS: ORAL_TABLET | ORAL | 0 refills | Status: DC

## 2017-02-22 MED ORDER — KETOROLAC TROMETHAMINE 30 MG/ML IJ SOLN
30.0000 mg | Freq: Once | INTRAMUSCULAR | Status: AC
  Administered 2017-02-22: 30 mg via INTRAMUSCULAR
  Filled 2017-02-22: qty 1

## 2017-02-22 MED ORDER — PREDNISONE 20 MG PO TABS
60.0000 mg | ORAL_TABLET | Freq: Once | ORAL | Status: AC
  Administered 2017-02-22: 60 mg via ORAL
  Filled 2017-02-22: qty 3

## 2017-02-22 MED ORDER — HYDROCODONE-ACETAMINOPHEN 5-325 MG PO TABS: 0.5000 | ORAL_TABLET | Freq: Four times a day (QID) | ORAL | 0 refills | Status: DC | PRN

## 2017-02-22 MED ORDER — HYDROCODONE-ACETAMINOPHEN 5-325 MG PO TABS
1.0000 | ORAL_TABLET | Freq: Once | ORAL | Status: AC
  Administered 2017-02-22: 1 via ORAL
  Filled 2017-02-22: qty 1

## 2017-02-22 NOTE — ED Provider Notes (Signed)
[facility] Provider Note   CSN: [id number] Arrival date & time: 02/22/17  0908     History   Chief Complaint Chief Complaint  Patient presents with  . Back Pain    HPI Monica Cohen is a 83 y.o. female.  Patient presents with continued pain starting in her right buttocks and radiating down the back of her right leg, right calf into her foot. Patient was originally seen in emergency department on 8/28. At that time she had an x-ray of her back and hip. She was treated with oral Vicodin, Toradol, and Decadron. Patient states that her symptoms have persisted. She is ambulatory at home with a cane or walker. Patient denies warning symptoms of back pain including: fecal incontinence, urinary retention or overflow incontinence, night sweats, waking from sleep with back pain, unexplained fevers or weight loss, h/o cancer, IVDU, recent trauma/falls. No history of spinal injections.        Past Medical History:  Diagnosis Date  . Diabetes mellitus without complication (HCC)   . Hypertension     Patient Active Problem List   Diagnosis Date Noted  . Hyponatremia 11/23/2016  . Acute metabolic encephalopathy 11/23/2016  . Essential hypertension 11/23/2016  . Diabetes mellitus (HCC) 11/23/2016  . Hypokalemia 11/23/2016    History reviewed. No pertinent surgical history.  OB History    No data available       Home Medications    Prior to Admission medications   Medication Sig Start Date End Date Taking? Authorizing Provider  acetaminophen (TYLENOL) 325 MG tablet Take 650 mg by mouth every 6 (six) hours as needed for mild pain.    [provider]  aspirin 325 MG tablet Take 325 mg by mouth daily.    [provider]  cephALEXin (KEFLEX) 500 MG capsule Take 1 capsule (500 mg total) by mouth 2 (two) times daily. 02/12/17   Kirichenko, Lemont Fillers, PA-C  glipiZIDE (GLUCOTROL) 5 MG tablet Take 0.5 tablets (2.5 mg total) by mouth daily before breakfast. 07/26/16    Dorena Bodo, NP  HYDROcodone-acetaminophen (NORCO) 5-325 MG tablet Take 0.5-1 tablets by mouth every 6 (six) hours as needed for moderate pain. 02/12/17   Kirichenko, Tatyana, PA-C  lisinopril (PRINIVIL,ZESTRIL) 10 MG tablet Take 1 tablet (10 mg total) by mouth daily. 11/28/16   Zannie Cove, MD    Family History History reviewed. No pertinent family history.  Social History Social History  Substance Use Topics  . Smoking status: Never Smoker  . Smokeless tobacco: Never Used  . Alcohol use No     Allergies   Patient has no known allergies.   Review of Systems Review of Systems  Constitutional: Negative for fever and unexpected weight change.  Cardiovascular: Negative for leg swelling.  Gastrointestinal: Negative for constipation.       Negative for fecal incontinence.   Genitourinary: Negative for dysuria, flank pain, hematuria, pelvic pain and vaginal bleeding.       Negative for urinary incontinence or retention.  Musculoskeletal: Positive for back pain. Negative for gait problem.  Neurological: Negative for weakness and numbness.       Denies saddle paresthesias.     Physical Exam Updated Vital Signs BP (!) 165/63   Pulse 61   Temp 98.4 F (36.9 C) (Oral)   Resp 16   SpO2 100%   Physical Exam  Constitutional: She appears well-developed and well-nourished.  HENT:  Head: Normocephalic and atraumatic.  Eyes: Conjunctivae are normal.  Neck: Normal range of motion. Neck  supple.  Pulmonary/Chest: Effort normal.  Abdominal: Soft. There is no tenderness. There is no CVA tenderness.  Musculoskeletal: Normal range of motion.  No step-off noted with palpation of spine. 5/5 lower extremity strength in flexion and extension, hips in rotation. No focal tenderness.   Neurological: She is alert. She has normal strength and normal reflexes. No sensory deficit.  5/5 strength in entire lower extremities bilaterally. No sensation deficit.   Skin: Skin is warm and dry. No  rash noted.  Psychiatric: She has a normal mood and affect.  Nursing note and vitals reviewed.    ED Treatments / Results  Labs (all labs ordered are listed, but only abnormal results are displayed) Labs Reviewed  CBG MONITORING, ED    EKG  EKG Interpretation None       Radiology No results found.  Procedures Procedures (including critical care time)  Medications Ordered in ED Medications  HYDROcodone-acetaminophen (NORCO/VICODIN) 5-325 MG per tablet 1 tablet (1 tablet Oral Given 02/22/17 1132)  ketorolac (TORADOL) 30 MG/ML injection 30 mg (30 mg Intramuscular Given 02/22/17 1132)  predniSONE (DELTASONE) tablet 60 mg (60 mg Oral Given 02/22/17 1209)     Initial Impression / Assessment and Plan / ED Course  I have reviewed the triage vital signs and the nursing notes.  Pertinent labs & imaging results that were available during my care of the patient were reviewed by me and considered in my medical decision making (see chart for details).     Patient seen and examined. Work-up initiated. Medications ordered.   Vital signs reviewed and are as follows: BP (!) 165/63   Pulse 61   Temp 98.4 F (36.9 C) (Oral)   Resp 16   SpO2 100%   Discussed with Dr. Criss AlvineGoldston who will see. Would consider longer course of steroids if patient can adequately check her blood sugar. Feel that she likely needs outpatient imaging, however no red flags other than age or other objective symptoms necessitating MRI at this time. Patient would like to go home today.  1:48 PM Pt seen with Dr. Criss AlvineGoldston. Will d/c to home with outpatient f/u, steroids with close blood sugar monitoring, Vicodin for pain (pt also prescribed this at last visit).   Encouraged patient to return with greatly elevated blood sugar. No red flag s/s of low back pain. Patient was counseled on back pain precautions and told to do activity as tolerated but do not lift, push, or pull heavy objects more than 10 pounds for the next  week.  Patient counseled to use ice or heat on back for no longer than 15 minutes every hour.   Patient prescribed narcotic pain medicine and counseled on proper use of narcotic pain medications. Counseled not to combine this medication with others containing tylenol. Use pain medication only under direct supervision at the lowest possible dose needed to control your pain.   Urged patient not to drink alcohol, drive, or perform any other activities that requires focus while taking either of these medications.  Patient urged to follow-up with PCP for recheck and consideration of more definitive imaging. Urged to return with worsening severe pain, loss of bowel or bladder control, trouble walking.   The patient verbalizes understanding and agrees with the plan.   Final Clinical Impressions(s) / ED Diagnoses   Final diagnoses:  Sciatica of right side   Patient with back pain with radicular features, ongoing for > 1 week. No neurological deficits. Patient is ambulatory. No warning symptoms of back pain including:  fecal incontinence, urinary retention or overflow incontinence, night sweats, waking from sleep with back pain, unexplained fevers or weight loss, h/o cancer, IVDU, recent trauma. No concern for cauda equina, epidural abscess, or other serious cause of back pain. Conservative measures such as rest, ice/heat and pain medicine indicated with PCP follow-up for recheck.  New Prescriptions New Prescriptions   PREDNISONE (DELTASONE) 20 MG TABLET    3 Tabs PO Days 1-3, then 2 tabs PO Days 4-6, then 1 tab PO Day 7-9, then Half Tab PO Day 10-12     Renne Crigler, PA-C 02/22/17 1353    Pricilla Loveless, MD 02/28/17 [phone]

## 2017-02-22 NOTE — ED Triage Notes (Signed)
Pt was seen here on 8/28 for right side lower back/hip pain. Was given pain meds and treated with antibiotics for UTI. Pt reports still having severe pain.

## 2017-02-22 NOTE — ED Notes (Signed)
Pt verbalized understanding discharge instructions and denies any further needs or questions at this time. VS stable, ambulatory and steady gait.   

## 2017-02-22 NOTE — Discharge Instructions (Signed)
Please read and follow all provided instructions.  Your diagnoses today include:  1. Sciatica of right side     Tests performed today include:  Vital signs - see below for your results today  Medications prescribed:   Prednisone - steroid medicine   It is best to take this medication in the morning to prevent sleeping problems. If you are diabetic, monitor your blood sugar closely and stop taking Prednisone if blood sugar is over 300. Take with food to prevent stomach upset.    Vicodin (hydrocodone/acetaminophen) - narcotic pain medication  DO NOT drive or perform any activities that require you to be awake and alert because this medicine can make you drowsy. BE VERY CAREFUL not to take multiple medicines containing Tylenol (also called acetaminophen). Doing so can lead to an overdose which can damage your liver and cause liver failure and possibly death.  Use pain medication only under direct supervision at the lowest possible dose needed to control your pain.   Take any prescribed medications only as directed.  Home care instructions:   Follow any educational materials contained in this packet  Please rest, use ice or heat on your back for the next several days  Do not lift, push, pull anything more than 10 pounds for the next week  Follow-up instructions: Please follow-up with your primary care provider in the next 1 week for further evaluation of your symptoms and consideration of outpatient imaging to look for bulging discs or other problems.   Return instructions:  SEEK IMMEDIATE MEDICAL ATTENTION IF YOU HAVE:  New numbness, tingling, weakness, or problem with the use of your arms or legs  Severe back pain not relieved with medications  Loss control of your bowels or bladder  Increasing pain in any areas of the body (such as chest or abdominal pain)  Shortness of breath, dizziness, or fainting.   Worsening nausea (feeling sick to your stomach), vomiting, fever,  or sweats  Any other emergent concerns regarding your health   Additional Information:  Your vital signs today were: BP (!) 109/94    Pulse 61    Temp 98.4 F (36.9 C) (Oral)    Resp 18    SpO2 95%  If your blood pressure (BP) was elevated above 135/85 this visit, please have this repeated by your doctor within one month. --------------

## 2017-03-08 DIAGNOSIS — M2550 Pain in unspecified joint: Secondary | ICD-10-CM | POA: Diagnosis not present

## 2017-03-08 DIAGNOSIS — M25561 Pain in right knee: Secondary | ICD-10-CM | POA: Diagnosis not present

## 2017-03-08 DIAGNOSIS — M5441 Lumbago with sciatica, right side: Secondary | ICD-10-CM | POA: Diagnosis not present

## 2017-03-08 DIAGNOSIS — E119 Type 2 diabetes mellitus without complications: Secondary | ICD-10-CM | POA: Diagnosis not present

## 2017-03-08 DIAGNOSIS — Z23 Encounter for immunization: Secondary | ICD-10-CM | POA: Diagnosis not present

## 2017-03-22 DIAGNOSIS — M5116 Intervertebral disc disorders with radiculopathy, lumbar region: Secondary | ICD-10-CM | POA: Diagnosis not present

## 2017-03-22 DIAGNOSIS — M4727 Other spondylosis with radiculopathy, lumbosacral region: Secondary | ICD-10-CM | POA: Diagnosis not present

## 2017-03-22 DIAGNOSIS — M1711 Unilateral primary osteoarthritis, right knee: Secondary | ICD-10-CM | POA: Diagnosis not present

## 2017-03-22 DIAGNOSIS — M4726 Other spondylosis with radiculopathy, lumbar region: Secondary | ICD-10-CM | POA: Diagnosis not present

## 2017-03-22 DIAGNOSIS — M5117 Intervertebral disc disorders with radiculopathy, lumbosacral region: Secondary | ICD-10-CM | POA: Diagnosis not present

## 2017-04-04 DIAGNOSIS — M25561 Pain in right knee: Secondary | ICD-10-CM | POA: Diagnosis not present

## 2017-04-04 DIAGNOSIS — G8929 Other chronic pain: Secondary | ICD-10-CM | POA: Diagnosis not present

## 2017-05-02 DIAGNOSIS — I10 Essential (primary) hypertension: Secondary | ICD-10-CM | POA: Diagnosis not present

## 2017-05-02 DIAGNOSIS — M5416 Radiculopathy, lumbar region: Secondary | ICD-10-CM | POA: Diagnosis not present

## 2017-05-02 DIAGNOSIS — Z6825 Body mass index (BMI) 25.0-25.9, adult: Secondary | ICD-10-CM | POA: Diagnosis not present

## 2017-05-16 ENCOUNTER — Ambulatory Visit (INDEPENDENT_AMBULATORY_CARE_PROVIDER_SITE_OTHER): Payer: Medicare Other | Admitting: Nurse Practitioner

## 2017-05-16 ENCOUNTER — Encounter: Payer: Self-pay | Admitting: Nurse Practitioner

## 2017-05-16 VITALS — BP 132/72 | HR 75 | Temp 98.2°F | Resp 17 | Ht 59.0 in | Wt 124.2 lb

## 2017-05-16 DIAGNOSIS — I10 Essential (primary) hypertension: Secondary | ICD-10-CM

## 2017-05-16 DIAGNOSIS — I639 Cerebral infarction, unspecified: Secondary | ICD-10-CM

## 2017-05-16 DIAGNOSIS — E118 Type 2 diabetes mellitus with unspecified complications: Secondary | ICD-10-CM

## 2017-05-16 DIAGNOSIS — M5136 Other intervertebral disc degeneration, lumbar region: Secondary | ICD-10-CM

## 2017-05-16 MED ORDER — ACETAMINOPHEN 500 MG PO TABS: 1000.0000 mg | ORAL_TABLET | Freq: Three times a day (TID) | ORAL | 0 refills | Status: AC | PRN

## 2017-05-16 NOTE — Patient Instructions (Signed)
May schedule tylenol 500 mg 1-2 tablet twice daily Okay to take tylenol 500 mg 1-2 tablet every 8 hours as needed for pain.

## 2017-05-16 NOTE — Progress Notes (Signed)
Careteam: Patient Care Team: Patient, No Pcp Per as PCP - General (General Practice) Earnie Larsson, MD as Consulting Physician (Neurosurgery)  Advanced Directive information Does Patient Have a Medical Advance Directive?: Yes, Type of Advance Directive: [place];Living will  No Known Allergies  Chief Complaint  Patient presents with  . Medical Management of Chronic Issues    Pt is being seen to establish care for management of chronic conditions. Pt was previously seen by Vicenta Aly with [facility]. Pt unsure of why she changed PCP.  Pt had 2 falls in August, she hurt her right hip and knee. Pt reports still having pain from falls.   . Other    Daughter in law, Vaughan Basta, in room  . Depression screening    score of 0     HPI: Patient is a 83 y.o. female seen in the office today to establish care and medical management. Recently moved to [place],  previously part of [facility] and wanted a change.  Was hospitalized due to hyponatremia in June, was on HCTZ but this was stopped.  Daughter-in-law here today with her.  Had AWV with previous PCP in May.   Had 2 falls in August and hurt her right hip and knee- she is now seeing the orthopedic for this, currently taking mobic, and tylenol for pain. States she was hospitalized for this as well but do not see records of this in [facility].  Has appt with neurosurgery for injection.  MRI was done- family reports that there was no acute injury just exacerbation of her arthritis.   Diabetes- taking glipizide 2.5 mg daily for this. No hypoglycemia.     Review of Systems:  Review of Systems  Constitutional: Negative for chills, fever and weight loss.  HENT: Positive for hearing loss (wears hearing aid). Negative for tinnitus.   Respiratory: Negative for cough, sputum production and shortness of breath.   Cardiovascular: Negative for chest pain, palpitations and leg swelling.  Gastrointestinal: Negative for  abdominal pain, constipation, diarrhea and heartburn.  Genitourinary: Negative for dysuria, frequency and urgency.  Musculoskeletal: Positive for back pain and joint pain. Negative for falls and myalgias.       Pain to lower back which radiates around right hip and now right leg  Skin: Negative.   Neurological: Negative for dizziness and headaches.  Psychiatric/Behavioral: Negative for depression and memory loss. The patient does not have insomnia.     Past Medical History:  Diagnosis Date  . Diabetes mellitus without complication ([place])   . Hypertension    Past Surgical History:  Procedure Laterality Date  . APPENDECTOMY    . HYSTEROTOMY     Social History:   reports that  has never smoked. she has never used smokeless tobacco. She reports that she does not drink alcohol or use drugs.  Family History  Problem Relation Age of Onset  . Diabetes Mother 26  . Diabetes Brother 1  . Ovarian cancer Sister 55  . Heart attack Sister 62  . Diabetes Sister 77    Medications:   Medication List        Accurate as of 05/16/17  9:37 AM. Always use your most recent med list.          acetaminophen 325 MG tablet Commonly known as:  TYLENOL   glipiZIDE 5 MG tablet Commonly known as:  GLUCOTROL Take 0.5 tablets (2.5 mg total) by mouth daily before breakfast.   meloxicam 7.5 MG tablet Commonly  known as:  MOBIC        Physical Exam:  Vitals:   05/16/17 0904  BP: 132/72  Pulse: 75  Resp: 17  Temp: 98.2 F (36.8 C)  TempSrc: Oral  SpO2: 99%  Weight: 124 lb 3.2 oz (56.3 kg)  Height: _0  (1.499 m)   Body mass index is 25.09 kg/m.  Physical Exam  Constitutional: She is oriented to person, place, and time. No distress.  Frail elderly female  HENT:  Head: Normocephalic and atraumatic.  Mouth/Throat: Oropharynx is clear and moist. No oropharyngeal exudate.  HOH  Eyes: Conjunctivae are normal. Pupils are equal, round, and reactive to light.  Neck: Normal range of  motion. Neck supple.  Cardiovascular: Normal rate, regular rhythm and normal heart sounds.  Pulmonary/Chest: Effort normal and breath sounds normal.  Abdominal: Soft. Bowel sounds are normal.  Musculoskeletal: She exhibits no edema or tenderness.  Neurological: She is alert and oriented to person, place, and time.  Skin: Skin is warm and dry. She is not diaphoretic.  Psychiatric: She has a normal mood and affect.    Labs reviewed: Basic Metabolic Panel: Recent Labs    11/23/16 2055  11/26/16 0154 11/27/16 0412 11/28/16 0436 02/12/17 1626  NA 118*   < > 132* 132* 136 143  K 3.2*   < > 3.9 3.9 4.1 4.3  CL 83*   < > 101 101 104 104  CO2 27   < > _1 --   GLUCOSE 86   < > 86 93 88 92  BUN 6   < > <5* 6 7 21*  CREATININE 0.88   < > 0.88 0.88 0.88 1.00  CALCIUM 9.3   < > 9.1 9.2 8.5*  --   TSH 1.033  --   --   --   --   --    < > = values in this interval not displayed.   Liver Function Tests: Recent Labs    11/23/16 1359  AST 33  ALT 21  ALKPHOS 49  BILITOT 1.5*  PROT 6.5  ALBUMIN 3.9   Recent Labs    11/23/16 1359  LIPASE 41   No results for input(s): AMMONIA in the last 8760 hours. CBC: Recent Labs    11/23/16 1359 11/24/16 0305 02/12/17 1553 02/12/17 1626  WBC 2.6* 3.4* 5.2  --   NEUTROABS  --   --  2.9  --   HGB 12.6 13.1 13.9 15.3*  HCT 34.6* 36.0 42.2 45.0  MCV 77.2* 77.3* 86.3  --   PLT 236 212 215  --    Lipid Panel: No results for input(s): CHOL, HDL, LDLCALC, TRIG, CHOLHDL, LDLDIRECT in the last 8760 hours. TSH: Recent Labs    11/23/16 2055  TSH 1.033   A1C: Lab Results  Component Value Date   HGBA1C 6.0 (H) 11/23/2016     Assessment/Plan 1. DDD (degenerative disc disease), lumbar persistent pain, has appt scheduled with Dr Trenton Gammon for injection -tylenol has been effective in helping with pain as well.  To cont this PRN - acetaminophen (TYLENOL) 500 MG tablet; Take 2 tablets (1,000 mg total) by mouth every 8 (eight) hours as  needed.  Dispense: 30 tablet; Refill: 0  2. Type 2 diabetes mellitus with complication, without long-term current use of insulin (HCC) -cont on glipizide 2.5 mg daily  - Hemoglobin A1c; Future  3. Essential hypertension -on medication in the past for this but currently diet controlled.  Will cont  to monitor off medication at this time.  - Lipid Panel; Future - CMP with eGFR; Future - CBC with Differential/Platelets; Future  Next appt: 6 weeks with lab work before. - needs MMSE Kristoffer Bala K. Harle Battiest  [facility] [phone] 8 am - 5 pm) [phone] (after hours)

## 2017-05-20 DIAGNOSIS — M48062 Spinal stenosis, lumbar region with neurogenic claudication: Secondary | ICD-10-CM | POA: Diagnosis not present

## 2017-05-20 DIAGNOSIS — M5416 Radiculopathy, lumbar region: Secondary | ICD-10-CM | POA: Diagnosis not present

## 2017-05-20 DIAGNOSIS — M25561 Pain in right knee: Secondary | ICD-10-CM | POA: Diagnosis not present

## 2017-05-20 DIAGNOSIS — I10 Essential (primary) hypertension: Secondary | ICD-10-CM | POA: Diagnosis not present

## 2017-05-20 DIAGNOSIS — E119 Type 2 diabetes mellitus without complications: Secondary | ICD-10-CM | POA: Diagnosis not present

## 2017-06-20 ENCOUNTER — Other Ambulatory Visit: Payer: Medicare Other

## 2017-06-20 DIAGNOSIS — I10 Essential (primary) hypertension: Secondary | ICD-10-CM

## 2017-06-20 DIAGNOSIS — E118 Type 2 diabetes mellitus with unspecified complications: Secondary | ICD-10-CM | POA: Diagnosis not present

## 2017-06-21 LAB — CBC WITH DIFFERENTIAL/PLATELET
BASOS ABS: 28 {cells}/uL (ref 0–200)
Basophils Relative: 0.6 %
EOS ABS: 69 {cells}/uL (ref 15–500)
Eosinophils Relative: 1.5 %
HCT: 40.9 % (ref 35.0–45.0)
HEMOGLOBIN: 13.6 g/dL (ref 11.7–15.5)
Lymphs Abs: 1601 cells/uL (ref 850–3900)
MCH: 28.4 pg (ref 27.0–33.0)
MCHC: 33.3 g/dL (ref 32.0–36.0)
MCV: 85.4 fL (ref 80.0–100.0)
MPV: 11.4 fL (ref 7.5–12.5)
Monocytes Relative: 9.1 %
NEUTROS PCT: 54 %
Neutro Abs: 2484 cells/uL (ref 1500–7800)
PLATELETS: 193 10*3/uL (ref 140–400)
RBC: 4.79 10*6/uL (ref 3.80–5.10)
RDW: 13 % (ref 11.0–15.0)
TOTAL LYMPHOCYTE: 34.8 %
WBC mixed population: 419 cells/uL (ref 200–950)
WBC: 4.6 10*3/uL (ref 3.8–10.8)

## 2017-06-21 LAB — HEMOGLOBIN A1C
EAG (MMOL/L): 7.4 (calc)
HEMOGLOBIN A1C: 6.3 %{Hb} — AB (ref <5.7)
Mean Plasma Glucose: 134 (calc)

## 2017-06-21 LAB — COMPLETE METABOLIC PANEL WITH GFR
AG Ratio: 1.7 (calc) (ref 1.0–2.5)
ALBUMIN MSPROF: 4 g/dL (ref 3.6–5.1)
ALKALINE PHOSPHATASE (APISO): 61 U/L (ref 33–130)
ALT: 13 U/L (ref 6–29)
AST: 16 U/L (ref 10–35)
BILIRUBIN TOTAL: 0.7 mg/dL (ref 0.2–1.2)
BUN / CREAT RATIO: 14 (calc) (ref 6–22)
BUN: 16 mg/dL (ref 7–25)
CHLORIDE: 104 mmol/L (ref 98–110)
CO2: 30 mmol/L (ref 20–32)
Calcium: 9.5 mg/dL (ref 8.6–10.4)
Creat: 1.11 mg/dL — ABNORMAL HIGH (ref 0.60–0.88)
GFR, Est African American: 51 mL/min/{1.73_m2} — ABNORMAL LOW (ref >=60)
GFR, Est Non African American: 44 mL/min/{1.73_m2} — ABNORMAL LOW (ref >=60)
GLUCOSE: 108 mg/dL — AB (ref 65–99)
Globulin: 2.3 g/dL (calc) (ref 1.9–3.7)
Potassium: 4.4 mmol/L (ref 3.5–5.3)
Sodium: 142 mmol/L (ref 135–146)
Total Protein: 6.3 g/dL (ref 6.1–8.1)

## 2017-06-21 LAB — LIPID PANEL
CHOL/HDL RATIO: 2.7 (calc) (ref <5.0)
Cholesterol: 218 mg/dL — ABNORMAL HIGH (ref <200)
HDL: 82 mg/dL (ref >50)
LDL Cholesterol (Calc): 110 mg/dL (calc) — ABNORMAL HIGH
NON-HDL CHOLESTEROL (CALC): 136 mg/dL — AB (ref <130)
Triglycerides: 138 mg/dL (ref <150)

## 2017-06-24 ENCOUNTER — Ambulatory Visit (INDEPENDENT_AMBULATORY_CARE_PROVIDER_SITE_OTHER): Payer: Medicare Other | Admitting: Nurse Practitioner

## 2017-06-24 ENCOUNTER — Encounter: Payer: Self-pay | Admitting: Nurse Practitioner

## 2017-06-24 VITALS — BP 128/76 | HR 72 | Temp 97.8°F | Resp 17 | Ht 59.0 in | Wt 124.0 lb

## 2017-06-24 DIAGNOSIS — E118 Type 2 diabetes mellitus with unspecified complications: Secondary | ICD-10-CM | POA: Diagnosis not present

## 2017-06-24 DIAGNOSIS — E2839 Other primary ovarian failure: Secondary | ICD-10-CM | POA: Diagnosis not present

## 2017-06-24 DIAGNOSIS — M5136 Other intervertebral disc degeneration, lumbar region: Secondary | ICD-10-CM | POA: Diagnosis not present

## 2017-06-24 DIAGNOSIS — Z1231 Encounter for screening mammogram for malignant neoplasm of breast: Secondary | ICD-10-CM | POA: Diagnosis not present

## 2017-06-24 DIAGNOSIS — M51369 Other intervertebral disc degeneration, lumbar region without mention of lumbar back pain or lower extremity pain: Secondary | ICD-10-CM

## 2017-06-24 DIAGNOSIS — M1711 Unilateral primary osteoarthritis, right knee: Secondary | ICD-10-CM | POA: Diagnosis not present

## 2017-06-24 DIAGNOSIS — E782 Mixed hyperlipidemia: Secondary | ICD-10-CM | POA: Diagnosis not present

## 2017-06-24 DIAGNOSIS — Z0000 Encounter for general adult medical examination without abnormal findings: Secondary | ICD-10-CM | POA: Diagnosis not present

## 2017-06-24 DIAGNOSIS — Z1239 Encounter for other screening for malignant neoplasm of breast: Secondary | ICD-10-CM

## 2017-06-24 DIAGNOSIS — I10 Essential (primary) hypertension: Secondary | ICD-10-CM

## 2017-06-24 MED ORDER — ROSUVASTATIN CALCIUM 5 MG PO TABS: 5.0000 mg | ORAL_TABLET | Freq: Every day | ORAL | 3 refills | Status: DC

## 2017-06-24 MED ORDER — LISINOPRIL 5 MG PO TABS: 5.0000 mg | ORAL_TABLET | Freq: Every day | ORAL | 3 refills | Status: DC

## 2017-06-24 NOTE — Progress Notes (Signed)
Careteam: Patient Care Team: Sharon SellerEubanks, Karas Pickerill K, NP as PCP - General (Geriatric Medicine) Julio SicksPool, Henry, MD as Consulting Physician (Neurosurgery)  Advanced Directive information Does Patient Have a Medical Advance Directive?: Yes, Type of Advance Directive: Healthcare Power of Attorney  No Known Allergies  Chief Complaint  Patient presents with  . Medical Management of Chronic Issues    Pt is being seen for an extended visit. Labs printed.   . Other    Pt daughter is in room  . MMSE    27/30; failed clock drawing.   . Depression screening    Score of 0     HPI: Patient is a 84 y.o. female seen in the office today for extended visit. Last AWV in May of 2018.  DM- A1c controlled at 6.3 with glipizide, no hypoglycemia. Not on ace inhibitor Has not had a diabetic eye exam.   Hyperlipidemia- LDL 110 goal < 70 not currently on any medication.   Currently living alone, very independent.   Very HOH- has hearing aids but they do not work well. States her daughter made her an appt.   Would like to be a FULL code  MMSE - Mini Mental State Exam 06/24/2017  Orientation to time 5  Orientation to Place 3  Registration 3  Attention/ Calculation 4  Recall 3  Language- name 2 objects 2  Language- repeat 1  Language- follow 3 step command 3  Language- read & follow direction 1  Write a sentence 1  Copy design 1  Total score 27    Review of Systems:  Review of Systems  Constitutional: Negative for chills, fever and weight loss.  HENT: Positive for hearing loss (wears hearing aid). Negative for tinnitus.   Respiratory: Negative for cough, sputum production and shortness of breath.   Cardiovascular: Negative for chest pain, palpitations and leg swelling.  Gastrointestinal: Negative for abdominal pain, constipation, diarrhea and heartburn.  Genitourinary: Positive for urgency. Negative for dysuria and frequency.  Musculoskeletal: Positive for back pain and joint pain. Negative  for falls and myalgias.       Pain to lower back which radiates around right hip and now right leg  Skin: Negative.   Neurological: Negative for dizziness and headaches.  Psychiatric/Behavioral: Negative for depression and memory loss. The patient does not have insomnia.     Past Medical History:  Diagnosis Date  . Arthritis   . Diabetes mellitus without complication (HCC)   . Hypertension    Past Surgical History:  Procedure Laterality Date  . APPENDECTOMY    . HYSTEROTOMY     Social History:   reports that  has never smoked. she has never used smokeless tobacco. She reports that she does not drink alcohol or use drugs.  Family History  Problem Relation Age of Onset  . Diabetes Mother 5089  . Diabetes Brother 6375  . Ovarian cancer Sister 7045  . Heart attack Sister 2963  . Diabetes Sister 3270    Medications:   Medication List        Accurate as of 06/24/17 11:06 AM. Always use your most recent med list.          acetaminophen 500 MG tablet Commonly known as:  TYLENOL Take 2 tablets (1,000 mg total) by mouth every 8 (eight) hours as needed.   glipiZIDE 5 MG tablet Commonly known as:  GLUCOTROL Take 0.5 tablets (2.5 mg total) by mouth daily before breakfast.   meloxicam 7.5 MG tablet Commonly known  as:  MOBIC        Physical Exam:  Vitals:   06/24/17 1047  BP: 128/76  Pulse: 72  Resp: 17  Temp: 97.8 F (36.6 C)  TempSrc: Oral  SpO2: 98%  Weight: 124 lb (56.2 kg)  Height: 4\' 11"  (1.499 m)   Body mass index is 25.04 kg/m.  Physical Exam  Constitutional: She is oriented to person, place, and time. She appears well-developed and well-nourished. No distress.  Frail elderly female  HENT:  Head: Normocephalic and atraumatic.  Right Ear: External ear normal.  Left Ear: External ear normal.  Nose: Nose normal.  Mouth/Throat: Oropharynx is clear and moist. No oropharyngeal exudate.  HOH  Eyes: Conjunctivae are normal. Pupils are equal, round, and reactive  to light.  Neck: Normal range of motion. Neck supple.  Cardiovascular: Normal rate, regular rhythm, normal heart sounds and intact distal pulses.  Pulmonary/Chest: Effort normal and breath sounds normal. She exhibits no mass. Right breast exhibits no inverted nipple and no mass. Left breast exhibits no inverted nipple and no mass.  Abdominal: Soft. Bowel sounds are normal.  Musculoskeletal: Normal range of motion. She exhibits no edema or tenderness.  Neurological: She is alert and oriented to person, place, and time.  Skin: Skin is warm and dry. She is not diaphoretic.  Psychiatric: She has a normal mood and affect.    Labs reviewed: Basic Metabolic Panel: Recent Labs    11/23/16 2055  11/27/16 0412 11/28/16 0436 02/12/17 1626 06/20/17 0854  NA 118*   < > 132* 136 143 142  K 3.2*   < > 3.9 4.1 4.3 4.4  CL 83*   < > 101 104 104 104  CO2 27   < > 24 23  --  30  GLUCOSE 86   < > 93 88 92 108*  BUN 6   < > 6 7 21* 16  CREATININE 0.88   < > 0.88 0.88 1.00 1.11*  CALCIUM 9.3   < > 9.2 8.5*  --  9.5  TSH 1.033  --   --   --   --   --    < > = values in this interval not displayed.   Liver Function Tests: Recent Labs    10/26/16 11/23/16 1359 06/20/17 0854  AST 26 33 16  ALT 27 21 13   ALKPHOS 65 49  --   BILITOT  --  1.5* 0.7  PROT  --  6.5 6.3  ALBUMIN  --  3.9  --    Recent Labs    11/23/16 1359  LIPASE 41   No results for input(s): AMMONIA in the last 8760 hours. CBC: Recent Labs    11/24/16 0305 02/12/17 1553 02/12/17 1626 06/20/17 0854  WBC 3.4* 5.2  --  4.6  NEUTROABS  --  2.9  --  2,484  HGB 13.1 13.9 15.3* 13.6  HCT 36.0 42.2 45.0 40.9  MCV 77.3* 86.3  --  85.4  PLT 212 215  --  193   Lipid Panel: Recent Labs    10/26/16 06/20/17 0854  CHOL 181 218*  HDL 76* 82  LDLCALC 85  --   TRIG 100 138  CHOLHDL  --  2.7   TSH: Recent Labs    11/23/16 2055  TSH 1.033   A1C: Lab Results  Component Value Date   HGBA1C 6.3 (H) 06/20/2017      Assessment/Plan 1. Estrogen deficiency - DG Bone Density; Future  2. Screening for  breast cancer - MM Digital Screening; Future  3. Wellness examination -doing well, lives alone but does not drive. Family very involved in taking her to appts, she does her on finances but family also getting involved in that. Takes her to appts. Even though she is 84 years old would like to be very aggressive with her care because she is so independent. She was educated on DNR vs full code and would like to remain FULL.  The patient was counseled regarding the appropriate use of alcohol, regular self-examination of the breasts on a monthly basis, prevention of dental and periodontal disease, diet, regular sustained exercise for at least 30 minutes 5 times per week, routine screening interval for mammogram as recommended by the American Cancer Society and ACOG, tobacco use,  and recommended schedule for GI hemoccult testing, colonoscopy, cholesterol, thyroid and diabetes screening.  4. Type 2 diabetes mellitus with complication, without long-term current use of insulin (HCC) -A1c at goal on glipizide. - Microalbumin, urine - lisinopril (PRINIVIL,ZESTRIL) 5 MG tablet; Take 1 tablet (5 mg total) by mouth daily.  Dispense: 90 tablet; Refill: 3  5. Essential hypertension -controlled.   6. Mixed hyperlipidemia Goal <70 due to diabetes, discussed risk vs benefit due to age, would like to be more proactive, will start crestor however discuss if she is having any side effects we will stop.  - rosuvastatin (CRESTOR) 5 MG tablet; Take 1 tablet (5 mg total) by mouth daily.  Dispense: 90 tablet; Refill: 3  7. DDD (degenerative disc disease), lumbar Stable at this time, without increase in pain today. To cont tylenol as needed  8. Primary osteoarthritis of right knee Without pain today, plans to get knee injection due to intermediate pain.   Next appt: 3 months with Dr Montez Morita, 6 months with myself Shanda Bumps K.  Biagio Borg  [facility] [phone] 8 am - 5 pm) [phone] (after hours)

## 2017-06-24 NOTE — Patient Instructions (Addendum)
To start crestor 5 mg by mouth daily for cholesterol To start lisinopril 2.5 mg by mouth daily for renal protection.   To follow up with Dr Montez Moritaarter in 3 months for routine follow up.   6 months with Shanda BumpsJessica NP  Make sure to set up eye doctor appt and dental appt

## 2017-06-25 ENCOUNTER — Encounter: Payer: Self-pay | Admitting: Nurse Practitioner

## 2017-06-25 DIAGNOSIS — M48061 Spinal stenosis, lumbar region without neurogenic claudication: Secondary | ICD-10-CM | POA: Insufficient documentation

## 2017-06-25 DIAGNOSIS — M48062 Spinal stenosis, lumbar region with neurogenic claudication: Secondary | ICD-10-CM | POA: Diagnosis not present

## 2017-06-25 DIAGNOSIS — M5416 Radiculopathy, lumbar region: Secondary | ICD-10-CM | POA: Diagnosis not present

## 2017-06-25 LAB — MICROALBUMIN, URINE: MICROALB UR: 11.8 mg/dL

## 2017-07-12 DIAGNOSIS — E119 Type 2 diabetes mellitus without complications: Secondary | ICD-10-CM | POA: Diagnosis not present

## 2017-07-12 DIAGNOSIS — Z961 Presence of intraocular lens: Secondary | ICD-10-CM | POA: Diagnosis not present

## 2017-07-12 DIAGNOSIS — H11153 Pinguecula, bilateral: Secondary | ICD-10-CM | POA: Diagnosis not present

## 2017-07-12 DIAGNOSIS — H18831 Recurrent erosion of cornea, right eye: Secondary | ICD-10-CM | POA: Diagnosis not present

## 2017-07-15 DIAGNOSIS — H11153 Pinguecula, bilateral: Secondary | ICD-10-CM | POA: Diagnosis not present

## 2017-07-15 DIAGNOSIS — H18831 Recurrent erosion of cornea, right eye: Secondary | ICD-10-CM | POA: Diagnosis not present

## 2017-07-15 DIAGNOSIS — E119 Type 2 diabetes mellitus without complications: Secondary | ICD-10-CM | POA: Diagnosis not present

## 2017-07-15 DIAGNOSIS — Z961 Presence of intraocular lens: Secondary | ICD-10-CM | POA: Diagnosis not present

## 2017-08-15 ENCOUNTER — Encounter: Payer: Self-pay | Admitting: Nurse Practitioner

## 2017-08-15 ENCOUNTER — Ambulatory Visit (INDEPENDENT_AMBULATORY_CARE_PROVIDER_SITE_OTHER): Payer: Medicare Other | Admitting: Nurse Practitioner

## 2017-08-15 VITALS — BP 148/70 | HR 65 | Temp 97.5°F | Ht 59.0 in | Wt 124.2 lb

## 2017-08-15 DIAGNOSIS — M5136 Other intervertebral disc degeneration, lumbar region: Secondary | ICD-10-CM

## 2017-08-15 DIAGNOSIS — R195 Other fecal abnormalities: Secondary | ICD-10-CM

## 2017-08-15 LAB — CBC WITH DIFFERENTIAL/PLATELET
BASOS PCT: 1.1 %
Basophils Absolute: 51 cells/uL (ref 0–200)
EOS PCT: 1.5 %
Eosinophils Absolute: 69 cells/uL (ref 15–500)
HCT: 40.9 % (ref 35.0–45.0)
Hemoglobin: 13.8 g/dL (ref 11.7–15.5)
Lymphs Abs: 1794 cells/uL (ref 850–3900)
MCH: 28.6 pg (ref 27.0–33.0)
MCHC: 33.7 g/dL (ref 32.0–36.0)
MCV: 84.7 fL (ref 80.0–100.0)
MPV: 11.9 fL (ref 7.5–12.5)
Monocytes Relative: 8.8 %
NEUTROS ABS: 2282 {cells}/uL (ref 1500–7800)
Neutrophils Relative %: 49.6 %
PLATELETS: 178 10*3/uL (ref 140–400)
RBC: 4.83 10*6/uL (ref 3.80–5.10)
RDW: 13.7 % (ref 11.0–15.0)
TOTAL LYMPHOCYTE: 39 %
WBC mixed population: 405 cells/uL (ref 200–950)
WBC: 4.6 10*3/uL (ref 3.8–10.8)

## 2017-08-15 MED ORDER — LISINOPRIL 5 MG PO TABS: 5.0000 mg | ORAL_TABLET | Freq: Every day | ORAL | 3 refills | Status: AC

## 2017-08-15 MED ORDER — GLIPIZIDE 5 MG PO TABS: 2.5000 mg | ORAL_TABLET | Freq: Every day | ORAL | 1 refills | Status: DC

## 2017-08-15 NOTE — Patient Instructions (Signed)
Will get blood work today There was no blood in your stool on exam  meloxicam can increase ur risk of bleeding in your intestinal tract If you have another episode please notify as soon as possible or seek medical attention

## 2017-08-15 NOTE — Progress Notes (Signed)
Careteam: Patient Care Team: Monica Seller, NP as PCP - General (Geriatric Medicine) [name], MD as Consulting Physician (Neurosurgery)  Advanced Directive information    No Known Allergies  Chief Complaint  Patient presents with  . Acute Visit    Blood in stool:onset 2-3 days ago , no pain now, blood has cleared up today ; 2/26 blood was dark black with small,amount of blood in stool  . Medication Refill    Refill glipizide and lisinopril   . other    R arm pain (shoulder)     HPI: Patient is a 84 y.o. female seen in the office today due to blood in stool.  4 days ago she went to the bathroom and she had a little pain when she moved her bowels and then noted dark black stool and it was formed. The next episode was diarrhea and dark. No red or blood just black/dark.  Yesterday it started to clear up. Today she had a normal bowel movement and normal color- not dark.  Reports she is a poor eater.  Never had dark stools before.  No dizziness or lightheadness, no chest pains or shortness of breath.  No nausea or vomiting.  First BM 4 days ago was painful then no pain after that.  2 stools that were dark.   Takes meloxicam daily for OA  Did not take blood pressure medication this morning.  Review of Systems:  Review of Systems  Constitutional: Negative for chills, fever and weight loss.  HENT: Positive for hearing loss (wears hearing aid). Negative for tinnitus.   Respiratory: Negative for cough, sputum production and shortness of breath.   Cardiovascular: Negative for chest pain, palpitations and leg swelling.  Gastrointestinal: Negative for abdominal pain, constipation, diarrhea and heartburn.       Dark stools  Genitourinary: Negative for dysuria, frequency and urgency.  Musculoskeletal: Positive for back pain and joint pain. Negative for falls and myalgias.  Skin: Negative.   Neurological: Negative for dizziness and headaches.    Past Medical History:    Diagnosis Date  . Arthritis   . DDD (degenerative disc disease), lumbosacral    with chronic back pain and radiculopathy with abnormal MRI  . Diabetes mellitus without complication (HCC)   . Hypertension    Past Surgical History:  Procedure Laterality Date  . APPENDECTOMY    . HYSTEROTOMY     Social History:   reports that  has never smoked. she has never used smokeless tobacco. She reports that she does not drink alcohol or use drugs.  Family History  Problem Relation Age of Onset  . Diabetes Mother 42  . Diabetes Brother 83  . Ovarian cancer Sister 7  . Heart attack Sister 85  . Diabetes Sister 32    Medications: Patient's Medications  New Prescriptions   No medications on file  Previous Medications   ACETAMINOPHEN (TYLENOL) 500 MG TABLET    Take 2 tablets (1,000 mg total) by mouth every 8 (eight) hours as needed.   GLIPIZIDE (GLUCOTROL) 5 MG TABLET    Take 0.5 tablets (2.5 mg total) by mouth daily before breakfast.   LISINOPRIL (PRINIVIL,ZESTRIL) 5 MG TABLET    Take 1 tablet (5 mg total) by mouth daily.   MELOXICAM (MOBIC) 7.5 MG TABLET    Take 7.5 mg by mouth daily. For hip and knee pain   ROSUVASTATIN (CRESTOR) 5 MG TABLET    Take 1 tablet (5 mg total) by mouth daily.  Modified Medications   No medications on file  Discontinued Medications   No medications on file     Physical Exam:  Vitals:   08/15/17 0958  BP: (!) 148/70  Pulse: 65  Temp: (!) 97.5 F (36.4 C)  TempSrc: Oral  SpO2: 98%  Weight: 124 lb 3.2 oz (56.3 kg)  Height: 4\' 11"  (1.499 m)   Body mass index is 25.09 kg/m.  Physical Exam  Constitutional: She is oriented to person, place, and time. She appears well-developed and well-nourished. No distress.  Frail elderly female  HENT:  Head: Normocephalic and atraumatic.  Right Ear: External ear normal.  Left Ear: External ear normal.  Nose: Nose normal.  Mouth/Throat: Oropharynx is clear and moist. No oropharyngeal exudate.  HOH  Eyes:  Conjunctivae are normal. Pupils are equal, round, and reactive to light.  Neck: Normal range of motion. Neck supple.  Cardiovascular: Normal rate, regular rhythm, normal heart sounds and intact distal pulses.  Pulmonary/Chest: Effort normal and breath sounds normal. She exhibits no mass. Right breast exhibits no inverted nipple and no mass. Left breast exhibits no inverted nipple and no mass.  Abdominal: Soft. Bowel sounds are normal.  Genitourinary: Rectum normal. Rectal exam shows no external hemorrhoid, no internal hemorrhoid and guaiac negative stool.  Musculoskeletal: Normal range of motion. She exhibits no edema or tenderness.  Neurological: She is alert and oriented to person, place, and time.  Skin: Skin is warm and dry. She is not diaphoretic.  Psychiatric: She has a normal mood and affect.    Labs reviewed: Basic Metabolic Panel: Recent Labs    11/23/16 2055  11/27/16 0412 11/28/16 0436 02/12/17 1626 06/20/17 0854  NA 118*   < > 132* 136 143 142  K 3.2*   < > 3.9 4.1 4.3 4.4  CL 83*   < > 101 104 104 104  CO2 27   < > 24 23  --  30  GLUCOSE 86   < > 93 88 92 108*  BUN 6   < > 6 7 21* 16  CREATININE 0.88   < > 0.88 0.88 1.00 1.11*  CALCIUM 9.3   < > 9.2 8.5*  --  9.5  TSH 1.033  --   --   --   --   --    < > = values in this interval not displayed.   Liver Function Tests: Recent Labs    10/26/16 11/23/16 1359 06/20/17 0854  AST 26 33 16  ALT 27 21 13   ALKPHOS 65 49  --   BILITOT  --  1.5* 0.7  PROT  --  6.5 6.3  ALBUMIN  --  3.9  --    Recent Labs    11/23/16 1359  LIPASE 41   No results for input(s): AMMONIA in the last 8760 hours. CBC: Recent Labs    11/24/16 0305 02/12/17 1553 02/12/17 1626 06/20/17 0854  WBC 3.4* 5.2  --  4.6  NEUTROABS  --  2.9  --  2,484  HGB 13.1 13.9 15.3* 13.6  HCT 36.0 42.2 45.0 40.9  MCV 77.3* 86.3  --  85.4  PLT 212 215  --  193   Lipid Panel: Recent Labs    10/26/16 06/20/17 0854  CHOL 181 218*  HDL 76* 82    LDLCALC 85  --   TRIG 100 138  CHOLHDL  --  2.7   TSH: Recent Labs    11/23/16 2055  TSH 1.033  A1C: Lab Results  Component Value Date   HGBA1C 6.3 (H) 06/20/2017     Assessment/Plan 1. Dark stools Currently resolved, may have been due to diet, hemoccult negative and no signs of bleeding. -she is on naproxen which puts her at risk for GI bleed, encouraged to minimize use of NSAID due to side effects/risk involved. - CBC with Differential/Platelets  2. DDD (degenerative disc disease), lumbar -ongoing use of naproxen, education given on adverse affects.  -to use tylenol 650 mg by mouth every 6 hours as needed pain  Strict return and follow up precautions given Cleven Jansma K. Biagio BorgEubanks, AGNP  [facility] [phone](Monday-Friday 8 am - 5 pm) [phone] (after hours)

## 2017-08-16 ENCOUNTER — Telehealth: Payer: Self-pay

## 2017-08-16 NOTE — Telephone Encounter (Signed)
I called patient to inform her that [employer] paperwork is complete. We need patient's signature prior to faxing. Patient was unable to hear me clearly and requested that I call her daughter [name].  I called [name], [name] will bring her mother by the office to sign paperwork. Paperwork was placed at the front Tax inspectordesk filing folder (alphabetized by last name), once signed I will fax.  Patient to be given copy if requested

## 2017-08-21 ENCOUNTER — Encounter: Payer: Self-pay | Admitting: Nurse Practitioner

## 2017-09-25 ENCOUNTER — Encounter: Payer: Self-pay | Admitting: Internal Medicine

## 2017-09-25 ENCOUNTER — Ambulatory Visit (INDEPENDENT_AMBULATORY_CARE_PROVIDER_SITE_OTHER): Payer: Medicare Other | Admitting: Internal Medicine

## 2017-09-25 VITALS — BP 158/70 | HR 68 | Temp 97.7°F | Resp 10 | Ht 59.0 in | Wt 125.0 lb

## 2017-09-25 DIAGNOSIS — M25511 Pain in right shoulder: Secondary | ICD-10-CM

## 2017-09-25 DIAGNOSIS — R0989 Other specified symptoms and signs involving the circulatory and respiratory systems: Secondary | ICD-10-CM | POA: Diagnosis not present

## 2017-09-25 DIAGNOSIS — E782 Mixed hyperlipidemia: Secondary | ICD-10-CM | POA: Diagnosis not present

## 2017-09-25 DIAGNOSIS — E118 Type 2 diabetes mellitus with unspecified complications: Secondary | ICD-10-CM | POA: Diagnosis not present

## 2017-09-25 DIAGNOSIS — G8929 Other chronic pain: Secondary | ICD-10-CM | POA: Diagnosis not present

## 2017-09-25 DIAGNOSIS — I10 Essential (primary) hypertension: Secondary | ICD-10-CM | POA: Diagnosis not present

## 2017-09-25 MED ORDER — GLIPIZIDE 5 MG PO TABS: 2.5000 mg | ORAL_TABLET | Freq: Every day | ORAL | 1 refills | Status: DC

## 2017-09-25 NOTE — Patient Instructions (Addendum)
Will call with lab results  Continue current medications as ordered  Recommend Orthopedic referral for chronic shoulder pain  Follow up in 4 mos with Shanda BumpsJessica for DM, HTN, hyperlipidemia, chronic joint pain

## 2017-09-25 NOTE — Progress Notes (Signed)
Patient ID: Monica Cohen, female   DOB: [birth year], 84 y.o.   MRN: [id number]   Location:  [facility] OFFICE  Provider: DR Arletha Grippe  Code Status:  Goals of Care:  Advanced Directives 09/25/2017  Does Patient Have a Medical Advance Directive? Yes  Type of Advance Directive [place] in Chart? Yes  Would patient like information on creating a medical advance directive? -     Chief Complaint  Patient presents with  . Medical Management of Chronic Issues    3 month follow-up per Sherrie Mustache  . Advance Care Planning    HPOA  . Medication Refill    Regill Glipizide   . Health Maintenance    Last eye exam 08/2017 (patient unable to recall eye doctor's name), patient states all vaccines up to date prior to moving to [place] (unable to provide dates)   . Best Practice Recommendations    Patient states BMD up to date     HPI: Patient is a 84 y.o. female seen today for medical management of chronic diseases.  She is HIH and wears hearing aids. No CP, SOB, f/c. Appetite ok and sleeps well.  DM - controlled. A1c 6.3%. BS at home <100 usually. Occasional low BS reaction. Urine microalbumin 11.8. She takes ACEI and statin.  OA/joint pain - she has right shoulder pain -->arm when the weather is "bad". She takes tylenol ES 2 tabs TID with meals. She takes meloxicam prn severe pain.  Hyperlipidemia - uncontrolled on crestor 93m daily. LDL 110  HTN - stable on lisinopril  Past Medical History:  Diagnosis Date  . Arthritis   . DDD (degenerative disc disease), lumbosacral    with chronic back pain and radiculopathy with abnormal MRI  . Diabetes mellitus without complication ([place]   . Hypertension     Past Surgical History:  Procedure Laterality Date  . APPENDECTOMY    . HYSTEROTOMY       reports that she has never smoked. She has never used smokeless tobacco. She reports that she does not drink alcohol or use drugs. Social  History   Socioeconomic History  . Marital status: Widowed    Spouse name: Not on file  . Number of children: Not on file  . Years of education: Not on file  . Highest education level: Not on file  Occupational History  . Not on file  Social Needs  . Financial resource strain: Not on file  . Food insecurity:    Worry: Not on file    Inability: Not on file  . Transportation needs:    Medical: Not on file    Non-medical: Not on file  Tobacco Use  . Smoking status: Never Smoker  . Smokeless tobacco: Never Used  Substance and Sexual Activity  . Alcohol use: No  . Drug use: No  . Sexual activity: Not Currently    Birth control/protection: None  Lifestyle  . Physical activity:    Days per week: Not on file    Minutes per session: Not on file  . Stress: Not on file  Relationships  . Social connections:    Talks on phone: Not on file    Gets together: Not on file    Attends religious service: Not on file    Active member of club or organization: Not on file    Attends meetings of clubs or organizations: Not on file    Relationship status: Not on  file  . Intimate partner violence:    Fear of current or ex partner: Not on file    Emotionally abused: Not on file    Physically abused: Not on file    Forced sexual activity: Not on file  Other Topics Concern  . Not on file  Social History Narrative   Social History      Diet?      Do you drink/eat things with caffeine? yes      Marital status?        widow                           What year were you married?      Do you live in a house, apartment, assisted living, condo, trailer, etc.? apartment      Is it one or more stories? two      How many persons live in your home? 1      Do you have any pets in your home? (please list) 1 dog      Highest level of education completed? High school      Current or past profession:      Do you exercise?                 yes                     Type & how often? Walking 3 x a  day      Advanced Directives      Do you have a living will? yes      Do you have a DNR form?                                  If not, do you want to discuss one? no      Do you have signed POA/HPOA for forms?       Functional Status      Do you have difficulty bathing or dressing yourself? no      Do you have difficulty preparing food or eating? no      Do you have difficulty managing your medications? yes      Do you have difficulty managing your finances? no      Do you have difficulty affording your medications? no    Family History  Problem Relation Age of Onset  . Diabetes Mother 59  . Diabetes Brother 19  . Ovarian cancer Sister 36  . Heart attack Sister 79  . Diabetes Sister 79    No Known Allergies  Outpatient Encounter Medications as of 09/25/2017  Medication Sig  . acetaminophen (TYLENOL) 500 MG tablet Take 2 tablets (1,000 mg total) by mouth every 8 (eight) hours as needed.  Marland Kitchen glipiZIDE (GLUCOTROL) 5 MG tablet Take 0.5 tablets (2.5 mg total) by mouth daily before breakfast.  . lisinopril (PRINIVIL,ZESTRIL) 5 MG tablet Take 1 tablet (5 mg total) by mouth daily.  . meloxicam (MOBIC) 7.5 MG tablet Take 7.5 mg by mouth daily. For hip and knee pain  . rosuvastatin (CRESTOR) 5 MG tablet Take 1 tablet (5 mg total) by mouth daily.   No facility-administered encounter medications on file as of 09/25/2017.     Review of Systems:  Review of Systems  HENT: Positive for hearing loss.   Musculoskeletal: Positive for arthralgias.  All other systems  reviewed and are negative.   Health Maintenance  Topic Date Due  . OPHTHALMOLOGY EXAM  04/09/1938  . TETANUS/TDAP  04/10/1947  . DEXA SCAN  04/09/1993  . PNA vac Low Risk Adult (1 of 2 - PCV13) 04/09/1993  . HEMOGLOBIN A1C  12/18/2017  . INFLUENZA VACCINE  01/16/2018  . FOOT EXAM  06/24/2018    Physical Exam: Vitals:   09/25/17 0849  BP: (!) 158/70  Pulse: 68  Resp: 10  Temp: 97.7 F (36.5 C)  TempSrc: Oral   SpO2: 98%  Weight: 125 lb (56.7 kg)  Height: _0  (1.499 m)   Body mass index is 25.25 kg/m. Physical Exam  Constitutional: She is oriented to person, place, and time. She appears well-developed and well-nourished.  HENT:  Mouth/Throat: Oropharynx is clear and moist. No oropharyngeal exudate.  MMM; no oral thrush  Eyes: Pupils are equal, round, and reactive to light. No scleral icterus.  Neck: Neck supple. Carotid bruit is present. No tracheal deviation present. No thyromegaly present.  Cardiovascular: Normal rate, regular rhythm and intact distal pulses. Exam reveals no gallop and no friction rub.  Murmur (-->carotid b/l) heard.  Systolic murmur is present with a grade of 1/6. Trace LE edema b/l. No calf TTP  Pulmonary/Chest: Effort normal and breath sounds normal. No stridor. No respiratory distress. She has no wheezes. She has no rales.  Abdominal: Soft. Normal appearance and bowel sounds are normal. She exhibits no distension and no mass. There is no hepatomegaly. There is no tenderness. There is no rigidity, no rebound and no guarding. No hernia.  obese  Musculoskeletal: She exhibits edema, tenderness and deformity (right knee).  Marked reduced ROM in right shoulder in all directions with crepitus and pain; anterior shoulder TTP with coracoid process TTP and swelling; right knee swelling with deformity present  Lymphadenopathy:    She has no cervical adenopathy.  Neurological: She is alert and oriented to person, place, and time. Gait (antalgic; unsteady) abnormal.  Skin: Skin is warm and dry. No rash noted.  Psychiatric: She has a normal mood and affect. Her behavior is normal. Judgment and thought content normal.    Labs reviewed: Basic Metabolic Panel: Recent Labs    11/23/16 2055  11/27/16 0412 11/28/16 0436 02/12/17 1626 06/20/17 0854  NA 118*   < > 132* 136 143 142  K 3.2*   < > 3.9 4.1 4.3 4.4  CL 83*   < > 101 104 104 104  CO2 27   < > 24 23  --  30  GLUCOSE  86   < > 93 88 92 108*  BUN 6   < > 6 7 21* 16  CREATININE 0.88   < > 0.88 0.88 1.00 1.11*  CALCIUM 9.3   < > 9.2 8.5*  --  9.5  TSH 1.033  --   --   --   --   --    < > = values in this interval not displayed.   Liver Function Tests: Recent Labs    10/26/16 11/23/16 1359 06/20/17 0854  AST 26 33 16  ALT _1 ALKPHOS 65 49  --   BILITOT  --  1.5* 0.7  PROT  --  6.5 6.3  ALBUMIN  --  3.9  --    Recent Labs    11/23/16 1359  LIPASE 41   No results for input(s): AMMONIA in the last 8760 hours. CBC: Recent Labs    02/12/17 1553  02/12/17 1626 06/20/17 0854 08/15/17 1047  WBC 5.2  --  4.6 4.6  NEUTROABS 2.9  --  2,484 2,282  HGB 13.9 15.3* 13.6 13.8  HCT 42.2 45.0 40.9 40.9  MCV 86.3  --  85.4 84.7  PLT 215  --  193 178   Lipid Panel: Recent Labs    10/26/16 06/20/17 0854  CHOL 181 218*  HDL 76* 82  LDLCALC 85 110*  TRIG 100 138  CHOLHDL  --  2.7   Lab Results  Component Value Date   HGBA1C 6.3 (H) 06/20/2017    Procedures since last visit: No results found.  Assessment/Plan   ICD-10-CM   1. Chronic right shoulder pain M25.511    G89.29   2. Type 2 diabetes mellitus with complication, without long-term current use of insulin (HCC) E11.8 BMP with eGFR([facility])    ALT    Hemoglobin A1c  3. Mixed hyperlipidemia E78.2 Lipid Panel  4. Essential hypertension I10   5. Bilateral carotid bruits R09.89 VAS US CAROTID   Will call with lab results  Continue current medications as ordered  Recommend Orthopedic referral for chronic shoulder pain  Follow up in 4 mos with Janett Billow for DM, HTN, hyperlipidemia, chronic joint pain   Jahzion Brogden S. Perlie Gold  [facility] [address] [place], [place] [address] [phone] Cell (Monday-Friday 8 AM - 5 PM) [phone] After 5 PM and follow prompts

## 2017-09-26 LAB — BASIC METABOLIC PANEL WITH GFR
BUN / CREAT RATIO: 20 (calc) (ref 6–22)
BUN: 20 mg/dL (ref 7–25)
CO2: 32 mmol/L (ref 20–32)
Calcium: 9.8 mg/dL (ref 8.6–10.4)
Chloride: 104 mmol/L (ref 98–110)
Creat: 0.98 mg/dL — ABNORMAL HIGH (ref 0.60–0.88)
GFR, EST AFRICAN AMERICAN: 59 mL/min/{1.73_m2} — AB (ref >=60)
GFR, EST NON AFRICAN AMERICAN: 51 mL/min/{1.73_m2} — AB (ref >=60)
Glucose, Bld: 110 mg/dL — ABNORMAL HIGH (ref 65–99)
POTASSIUM: 4.3 mmol/L (ref 3.5–5.3)
SODIUM: 144 mmol/L (ref 135–146)

## 2017-09-26 LAB — LIPID PANEL
CHOLESTEROL: 201 mg/dL — AB (ref <200)
HDL: 74 mg/dL (ref >50)
LDL CHOLESTEROL (CALC): 107 mg/dL — AB
Non-HDL Cholesterol (Calc): 127 mg/dL (calc) (ref <130)
TRIGLYCERIDES: 108 mg/dL (ref <150)
Total CHOL/HDL Ratio: 2.7 (calc) (ref <5.0)

## 2017-09-26 LAB — HEMOGLOBIN A1C
Hgb A1c MFr Bld: 6.2 % of total Hgb — ABNORMAL HIGH (ref <5.7)
MEAN PLASMA GLUCOSE: 131 (calc)
eAG (mmol/L): 7.3 (calc)

## 2017-09-26 LAB — ALT: ALT: 18 U/L (ref 6–29)

## 2017-10-01 ENCOUNTER — Inpatient Hospital Stay (HOSPITAL_COMMUNITY): Admission: RE | Admit: 2017-10-01 | Payer: Medicare Other | Source: Ambulatory Visit

## 2017-10-14 ENCOUNTER — Telehealth: Payer: Self-pay | Admitting: Nurse Practitioner

## 2017-10-14 NOTE — Telephone Encounter (Signed)
Left msg asking pt to call me at [phone] to confirm this AWV-S w/ nurse. Last AWV May 2018 w/ different provider. VDM (DD)

## 2017-11-15 ENCOUNTER — Ambulatory Visit (INDEPENDENT_AMBULATORY_CARE_PROVIDER_SITE_OTHER): Payer: Medicare Other

## 2017-11-15 VITALS — BP 144/60 | HR 65 | Temp 97.6°F | Ht 63.0 in | Wt 125.0 lb

## 2017-11-15 DIAGNOSIS — Z0000 Encounter for general adult medical examination without abnormal findings: Secondary | ICD-10-CM | POA: Diagnosis not present

## 2017-11-15 NOTE — Progress Notes (Signed)
Subjective:   Monica Cohen is a 84 y.o. female who presents for Medicare Annual (Subsequent) preventive examination.  Left a note for patient's daughter to call me if she has any questions about the visit.  Last AWV-10/16/2016    Objective:     Vitals: BP (!) 144/60 (BP Location: Left Arm, Patient Position: Sitting)   Pulse 65   Temp 97.6 F (36.4 C) (Oral)   Ht  (1.6 m)   Wt 125 lb (56.7 kg)   SpO2 98%   BMI 22.14 kg/m   Body mass index is 22.14 kg/m.  Advanced Directives 11/15/2017 09/25/2017 06/24/2017 05/16/2017 02/22/2017 02/12/2017 11/23/2016  Does Patient Have a Medical Advance Directive? Yes Yes Yes Yes No No No  Type of Social research officer, government Power of [employer] Power of [place];Living will - - -  Does patient want to make changes to medical advance directive? No - Patient declined - - - - - -  Copy of Healthcare Power of Attorney in Chart? Yes Yes Yes No - copy requested - - -  Would patient like information on creating a medical advance directive? - - - - - No - Patient declined No - Patient declined    Tobacco Social History   Tobacco Use  Smoking Status Never Smoker  Smokeless Tobacco Never Used     Counseling given: Not Answered   Clinical Intake:  Pre-visit preparation completed: No  Pain : No/denies pain     Nutritional Risks: None Diabetes: Yes CBG done?: No Did pt. bring in CBG monitor from home?: No  How often do you need to have someone help you when you read instructions, pamphlets, or other written materials from your doctor or pharmacy?: 1 - Never What is the last grade level you completed in school?: HS  Interpreter Needed?: No  Information entered by :: Monica Russell, RN  Past Medical History:  Diagnosis Date  . Arthritis   . DDD (degenerative disc disease), lumbosacral    with chronic back pain and radiculopathy with abnormal MRI  . Diabetes mellitus  without complication (HCC)   . Hypertension    Past Surgical History:  Procedure Laterality Date  . APPENDECTOMY    . HYSTEROTOMY     Family History  Problem Relation Age of Onset  . Diabetes Mother 32  . Diabetes Brother 12  . Ovarian cancer Sister 51  . Heart attack Sister 39  . Diabetes Sister 59   Social History   Socioeconomic History  . Marital status: Widowed    Spouse name: Not on file  . Number of children: Not on file  . Years of education: Not on file  . Highest education level: Not on file  Occupational History  . Not on file  Social Needs  . Financial resource strain: Not hard at all  . Food insecurity:    Worry: Never true    Inability: Never true  . Transportation needs:    Medical: No    Non-medical: No  Tobacco Use  . Smoking status: Never Smoker  . Smokeless tobacco: Never Used  Substance and Sexual Activity  . Alcohol use: No  . Drug use: No  . Sexual activity: Not Currently    Birth control/protection: None  Lifestyle  . Physical activity:    Days per week: 7 days    Minutes per session: 30 min  . Stress: Only a little  Relationships  . Social connections:  Talks on phone: More than three times a week    Gets together: More than three times a week    Attends religious service: More than 4 times per year    Active member of club or organization: No    Attends meetings of clubs or organizations: Never    Relationship status: Widowed  Other Topics Concern  . Not on file  Social History Narrative   Social History      Diet?      Do you drink/eat things with caffeine? yes      Marital status?        widow                           What year were you married?      Do you live in a house, apartment, assisted living, condo, trailer, etc.? apartment      Is it one or more stories? two      How many persons live in your home? 1      Do you have any pets in your home? (please list) 1 dog      Highest level of education completed?  High school      Current or past profession:      Do you exercise?                 yes                     Type & how often? Walking 3 x a day      Advanced Directives      Do you have a living will? yes      Do you have a DNR form?                                  If not, do you want to discuss one? no      Do you have signed POA/HPOA for forms?       Functional Status      Do you have difficulty bathing or dressing yourself? no      Do you have difficulty preparing food or eating? no      Do you have difficulty managing your medications? yes      Do you have difficulty managing your finances? no      Do you have difficulty affording your medications? no    Outpatient Encounter Medications as of 11/15/2017  Medication Sig  . acetaminophen (TYLENOL) 500 MG tablet Take 2 tablets (1,000 mg total) by mouth every 8 (eight) hours as needed.  Marland Kitchen glipiZIDE (GLUCOTROL) 5 MG tablet Take 0.5 tablets (2.5 mg total) by mouth daily before breakfast.  . lisinopril (PRINIVIL,ZESTRIL) 5 MG tablet Take 1 tablet (5 mg total) by mouth daily.  . meloxicam (MOBIC) 7.5 MG tablet Take 7.5 mg by mouth daily. For hip and knee pain  . rosuvastatin (CRESTOR) 5 MG tablet Take 1 tablet (5 mg total) by mouth daily.   No facility-administered encounter medications on file as of 11/15/2017.     Activities of Daily Living In your present state of health, do you have any difficulty performing the following activities: 11/15/2017 11/23/2016  Hearing? Monica Cohen  Vision? N N  Difficulty concentrating or making decisions? Y N  Walking or climbing stairs? N N  Dressing or bathing? Monica Cohen  Doing errands, shopping? N Y  Quarry manager and eating ? N -  Using the Toilet? N -  In the past six months, have you accidently leaked urine? Y -  Do you have problems with loss of bowel control? N -  Managing your Medications? N -  Managing your Finances? N -  Housekeeping or managing your Housekeeping? N -  Some recent data  might be hidden    Patient Care Team: Monica Seller, NP as PCP - General (Geriatric Medicine) Monica Sicks, MD as Consulting Physician (Neurosurgery)    Assessment:   This is a routine wellness examination for Monica Cohen.  Exercise Activities and Dietary recommendations Current Exercise Habits: Home exercise routine, Type of exercise: walking, Time (Minutes): 30, Frequency (Times/Week): 7, Weekly Exercise (Minutes/Week): 210, Intensity: Mild, Exercise limited by: None identified  Goals    None      Fall Risk Fall Risk  11/15/2017 09/25/2017 08/15/2017 06/24/2017 05/16/2017  Falls in the past year? No No No No Yes  Number falls in past yr: - - - - 2 or more  Comment - - - - Pt felt like knee just gave out.  Injury with Fall? - - - - Yes   Is the patient's home free of loose throw rugs in walkways, pet beds, electrical cords, etc?   yes      Grab bars in the bathroom? yes      Handrails on the stairs?   yes      Adequate lighting?   yes  Timed Get Up and Go performed: 24 seconds  Depression Screen PHQ 2/9 Scores 11/15/2017 06/24/2017 05/16/2017  PHQ - 2 Score 0 0 0     Cognitive Function completed within last year MMSE - Mini Mental State Exam 06/24/2017  Orientation to time 5  Orientation to Place 3  Registration 3  Attention/ Calculation 4  Recall 3  Language- name 2 objects 2  Language- repeat 1  Language- follow 3 step command 3  Language- read & follow direction 1  Write a sentence 1  Copy design 1  Total score 27        Immunization History  Administered Date(s) Administered  . Influenza, High Dose Seasonal PF 03/05/2017  . Influenza,inj,Quad PF,6+ Mos 03/27/2016    Qualifies for Shingles Vaccine? Yes, educated and ordered to pharamcy  Screening Tests Health Maintenance  Topic Date Due  . OPHTHALMOLOGY EXAM  [birth year]09[birth year]1938  . TETANUS/TDAP  04/10/1947  . DEXA SCAN  04/09/1993  . PNA vac Low Risk Adult (1 of 2 - PCV13) 04/09/1993  . INFLUENZA VACCINE   01/16/2018  . HEMOGLOBIN A1C  03/27/2018  . FOOT EXAM  06/24/2018    Cancer Screenings: Lung: Low Dose CT Chest recommended if Age 67-80 years, 30 pack-year currently smoking OR have quit w/in 15years. Patient does not qualify. Breast:  Up to date on Mammogram? Yes   Up to date of Bone Density/Dexa? Yes Colorectal: up to date  Additional Screenings:  Hepatitis C Screening: declined     Plan:    I have personally reviewed and addressed the Medicare Annual Wellness questionnaire and have noted the following in the patient's chart:  A. Medical and social history B. Use of alcohol, tobacco or illicit drugs  C. Current medications and supplements D. Functional ability and status E.  Nutritional status F.  Physical activity G. Advance directives H. List of other physicians I.  Hospitalizations, surgeries, and ER visits in previous 12 months J.  Vitals K. Screenings to include hearing, vision, cognitive, depression L. Referrals and appointments - none  In addition, I have reviewed and discussed with patient certain preventive protocols, quality metrics, and best practice recommendations. A written personalized care plan for preventive services as well as general preventive health recommendations were provided to patient.  See attached scanned questionnaire for additional information.   Signed,   Monica Russell, RN Nurse Health Advisor  Patient Concerns: none

## 2017-11-15 NOTE — Patient Instructions (Addendum)
Monica Cohen , Thank you for taking time to come for your Medicare Wellness Visit. I appreciate your ongoing commitment to your health goals. Please review the following plan we discussed and let me know if I can assist you in the future.   Screening recommendations/referrals: Colonoscopy excluded, over age 82 Mammogram excluded, over age 96 Bone Density due, ordered in january Recommended yearly ophthalmology/optometry visit for glaucoma screening and checkup Recommended yearly dental visit for hygiene and checkup  Vaccinations: Influenza vaccine up to date, due 2019 fall season Pneumococcal vaccine possibly due-please check with insurance if you ever received Prevnar or Pneumovax Tdap vaccine possibly due-please check with insurance for the last one you recieved Shingles vaccine due, ordered to pharmacy    Advanced directives: in chart  Conditions/risks identified: none  Next appointment: Abbey Chatters, NP 12/18/2017 @ 8:30am           Tyron Russell, RN 11/17/2018 @ 9:15am   Preventive Care 65 Years and Older, Female Preventive care refers to lifestyle choices and visits with your health care provider that can promote health and wellness. What does preventive care include?  A yearly physical exam. This is also called an annual well check.  Dental exams once or twice a year.  Routine eye exams. Ask your health care provider how often you should have your eyes checked.  Personal lifestyle choices, including:  Daily care of your teeth and gums.  Regular physical activity.  Eating a healthy diet.  Avoiding tobacco and drug use.  Limiting alcohol use.  Practicing safe sex.  Taking low-dose aspirin every day.  Taking vitamin and mineral supplements as recommended by your health care provider. What happens during an annual well check? The services and screenings done by your health care provider during your annual well check will depend on your age, overall health,  lifestyle risk factors, and family history of disease. Counseling  Your health care provider may ask you questions about your:  Alcohol use.  Tobacco use.  Drug use.  Emotional well-being.  Home and relationship well-being.  Sexual activity.  Eating habits.  History of falls.  Memory and ability to understand (cognition).  Work and work Astronomer.  Reproductive health. Screening  You may have the following tests or measurements:  Height, weight, and BMI.  Blood pressure.  Lipid and cholesterol levels. These may be checked every 5 years, or more frequently if you are over 15 years old.  Skin check.  Lung cancer screening. You may have this screening every year starting at age 35 if you have a 30-pack-year history of smoking and currently smoke or have quit within the past 15 years.  Fecal occult blood test (FOBT) of the stool. You may have this test every year starting at age 38.  Flexible sigmoidoscopy or colonoscopy. You may have a sigmoidoscopy every 5 years or a colonoscopy every 10 years starting at age 49.  Hepatitis C blood test.  Hepatitis B blood test.  Sexually transmitted disease (STD) testing.  Diabetes screening. This is done by checking your blood sugar (glucose) after you have not eaten for a while (fasting). You may have this done every 1-3 years.  Bone density scan. This is done to screen for osteoporosis. You may have this done starting at age 49.  Mammogram. This may be done every 1-2 years. Talk to your health care provider about how often you should have regular mammograms. Talk with your health care provider about your test results, treatment options, and if  necessary, the need for more tests. Vaccines  Your health care provider may recommend certain vaccines, such as:  Influenza vaccine. This is recommended every year.  Tetanus, diphtheria, and acellular pertussis (Tdap, Td) vaccine. You may need a Td booster every 10 years.  Zoster  vaccine. You may need this after age 24.  Pneumococcal 13-valent conjugate (PCV13) vaccine. One dose is recommended after age 27.  Pneumococcal polysaccharide (PPSV23) vaccine. One dose is recommended after age 23. Talk to your health care provider about which screenings and vaccines you need and how often you need them. This information is not intended to replace advice given to you by your health care provider. Make sure you discuss any questions you have with your health care provider. Document Released: 07/01/2015 Document Revised: 02/22/2016 Document Reviewed: 04/05/2015 Elsevier Interactive Patient Education  2017 [place] Prevention in the Home Falls can cause injuries. They can happen to people of all ages. There are many things you can do to make your home safe and to help prevent falls. What can I do on the outside of my home?  Regularly fix the edges of walkways and driveways and fix any cracks.  Remove anything that might make you trip as you walk through a door, such as a raised step or threshold.  Trim any bushes or trees on the path to your home.  Use bright outdoor lighting.  Clear any walking paths of anything that might make someone trip, such as rocks or tools.  Regularly check to see if handrails are loose or broken. Make sure that both sides of any steps have handrails.  Any raised decks and porches should have guardrails on the edges.  Have any leaves, snow, or ice cleared regularly.  Use sand or salt on walking paths during winter.  Clean up any spills in your garage right away. This includes oil or grease spills. What can I do in the bathroom?  Use night lights.  Install grab bars by the toilet and in the tub and shower. Do not use towel bars as grab bars.  Use non-skid mats or decals in the tub or shower.  If you need to sit down in the shower, use a plastic, non-slip stool.  Keep the floor dry. Clean up any water that spills on the  floor as soon as it happens.  Remove soap buildup in the tub or shower regularly.  Attach bath mats securely with double-sided non-slip rug tape.  Do not have throw rugs and other things on the floor that can make you trip. What can I do in the bedroom?  Use night lights.  Make sure that you have a light by your bed that is easy to reach.  Do not use any sheets or blankets that are too big for your bed. They should not hang down onto the floor.  Have a firm chair that has side arms. You can use this for support while you get dressed.  Do not have throw rugs and other things on the floor that can make you trip. What can I do in the kitchen?  Clean up any spills right away.  Avoid walking on wet floors.  Keep items that you use a lot in easy-to-reach places.  If you need to reach something above you, use a strong step stool that has a grab bar.  Keep electrical cords out of the way.  Do not use floor polish or wax that makes floors slippery. If you must  use wax, use non-skid floor wax.  Do not have throw rugs and other things on the floor that can make you trip. What can I do with my stairs?  Do not leave any items on the stairs.  Make sure that there are handrails on both sides of the stairs and use them. Fix handrails that are broken or loose. Make sure that handrails are as long as the stairways.  Check any carpeting to make sure that it is firmly attached to the stairs. Fix any carpet that is loose or worn.  Avoid having throw rugs at the top or bottom of the stairs. If you do have throw rugs, attach them to the floor with carpet tape.  Make sure that you have a light switch at the top of the stairs and the bottom of the stairs. If you do not have them, ask someone to add them for you. What else can I do to help prevent falls?  Wear shoes that:  Do not have high heels.  Have rubber bottoms.  Are comfortable and fit you well.  Are closed at the toe. Do not wear  sandals.  If you use a stepladder:  Make sure that it is fully opened. Do not climb a closed stepladder.  Make sure that both sides of the stepladder are locked into place.  Ask someone to hold it for you, if possible.  Clearly mark and make sure that you can see:  Any grab bars or handrails.  First and last steps.  Where the edge of each step is.  Use tools that help you move around (mobility aids) if they are needed. These include:  Canes.  Walkers.  Scooters.  Crutches.  Turn on the lights when you go into a dark area. Replace any light bulbs as soon as they burn out.  Set up your furniture so you have a clear path. Avoid moving your furniture around.  If any of your floors are uneven, fix them.  If there are any pets around you, be aware of where they are.  Review your medicines with your doctor. Some medicines can make you feel dizzy. This can increase your chance of falling. Ask your doctor what other things that you can do to help prevent falls. This information is not intended to replace advice given to you by your health care provider. Make sure you discuss any questions you have with your health care provider. Document Released: 03/31/2009 Document Revised: 11/10/2015 Document Reviewed: 07/09/2014 Elsevier Interactive Patient Education  2017 [employer].

## 2017-12-17 DIAGNOSIS — H04123 Dry eye syndrome of bilateral lacrimal glands: Secondary | ICD-10-CM | POA: Diagnosis not present

## 2017-12-17 DIAGNOSIS — Z961 Presence of intraocular lens: Secondary | ICD-10-CM | POA: Diagnosis not present

## 2017-12-17 DIAGNOSIS — E119 Type 2 diabetes mellitus without complications: Secondary | ICD-10-CM | POA: Diagnosis not present

## 2017-12-18 ENCOUNTER — Ambulatory Visit (INDEPENDENT_AMBULATORY_CARE_PROVIDER_SITE_OTHER): Payer: Medicare Other | Admitting: Nurse Practitioner

## 2017-12-18 ENCOUNTER — Encounter: Payer: Self-pay | Admitting: Nurse Practitioner

## 2017-12-18 VITALS — BP 142/70 | HR 65 | Temp 98.6°F | Ht 63.0 in | Wt 130.0 lb

## 2017-12-18 DIAGNOSIS — M1711 Unilateral primary osteoarthritis, right knee: Secondary | ICD-10-CM

## 2017-12-18 DIAGNOSIS — E118 Type 2 diabetes mellitus with unspecified complications: Secondary | ICD-10-CM

## 2017-12-18 DIAGNOSIS — I10 Essential (primary) hypertension: Secondary | ICD-10-CM | POA: Diagnosis not present

## 2017-12-18 DIAGNOSIS — I872 Venous insufficiency (chronic) (peripheral): Secondary | ICD-10-CM

## 2017-12-18 DIAGNOSIS — Z23 Encounter for immunization: Secondary | ICD-10-CM | POA: Diagnosis not present

## 2017-12-18 DIAGNOSIS — E782 Mixed hyperlipidemia: Secondary | ICD-10-CM | POA: Diagnosis not present

## 2017-12-18 MED ORDER — MELOXICAM 7.5 MG PO TABS: 7.5000 mg | ORAL_TABLET | Freq: Every day | ORAL | 1 refills | Status: DC

## 2017-12-18 MED ORDER — GLIPIZIDE 5 MG PO TABS: 2.5000 mg | ORAL_TABLET | Freq: Every day | ORAL | 1 refills | Status: DC

## 2017-12-18 MED ORDER — TETANUS-DIPHTH-ACELL PERTUSSIS 5-2.5-18.5 LF-MCG/0.5 IM SUSP: 0.5000 mL | Freq: Once | INTRAMUSCULAR | 0 refills | Status: AC

## 2017-12-18 NOTE — Patient Instructions (Addendum)
Follow up in 3 months, will get fasting lab work at next appt   To wear compression hose to help with swelling of lower legs Cut back on sodium (salt) and foods high in salt  Elevate legs when you are sitting above the level of the heart

## 2017-12-18 NOTE — Progress Notes (Signed)
Careteam: Patient Care Team: Sharon Seller, NP as PCP - General (Geriatric Medicine) [name], MD as Consulting Physician (Neurosurgery)  Advanced Directive information Does Patient Have a Medical Advance Directive?: Yes, Type of Advance Directive: Healthcare Power of Attorney  No Known Allergies  Chief Complaint  Patient presents with  . Medical Management of Chronic Issues    Pt is being seen for a 6 month routine visit.   . ACP    Pt has HCPOA in chart  . Medication Refill    Rx pended  . Immunizations    Rx for boostrix printed and pt wants prevnar 13 today     HPI: Patient is a 84 y.o. female seen in the office today for routine follow up. Reports she is having trouble with her eyes and it makes her head hurt. Went to ophthalmologist yesterday and they diagnosised her with dry eye and placed her on drops.   DM- currently taking glipizide 5 mg daily, A1c 6.2 in April, no low blood sugars.   htn- controlled on lisinopril 5 mg daily   Osteoarthritis- taking mobic 7.5 for knee pain which helps.   Hyperlipidemia- LDL 107 in April 2019, currently on crestor 5 mg by mouth daily    Review of Systems:  Review of Systems  Constitutional: Negative for chills, fever and weight loss.  HENT: Positive for hearing loss (wears hearing aid). Negative for tinnitus.   Respiratory: Negative for cough, sputum production and shortness of breath.   Cardiovascular: Positive for leg swelling. Negative for chest pain and palpitations.  Gastrointestinal: Negative for abdominal pain, constipation, diarrhea and heartburn.  Genitourinary: Negative for dysuria, frequency and urgency.  Musculoskeletal: Positive for back pain and joint pain (bilateral knee pain). Negative for falls and myalgias.  Skin: Negative.   Neurological: Negative for dizziness and headaches.  Psychiatric/Behavioral: Negative for depression and memory loss. The patient does not have insomnia.     Past Medical  History:  Diagnosis Date  . Arthritis   . DDD (degenerative disc disease), lumbosacral    with chronic back pain and radiculopathy with abnormal MRI  . Diabetes mellitus without complication (HCC)   . Hypertension    Past Surgical History:  Procedure Laterality Date  . APPENDECTOMY    . HYSTEROTOMY     Social History:   reports that she has never smoked. She has never used smokeless tobacco. She reports that she does not drink alcohol or use drugs.  Family History  Problem Relation Age of Onset  . Diabetes Mother 68  . Diabetes Brother 22  . Ovarian cancer Sister 25  . Heart attack Sister 3  . Diabetes Sister 31    Medications: Patient's Medications  New Prescriptions   No medications on file  Previous Medications   ACETAMINOPHEN (TYLENOL) 500 MG TABLET    Take 2 tablets (1,000 mg total) by mouth every 8 (eight) hours as needed.   GLIPIZIDE (GLUCOTROL) 5 MG TABLET    Take 0.5 tablets (2.5 mg total) by mouth daily before breakfast.   LISINOPRIL (PRINIVIL,ZESTRIL) 5 MG TABLET    Take 1 tablet (5 mg total) by mouth daily.   MELOXICAM (MOBIC) 7.5 MG TABLET    Take 7.5 mg by mouth daily. For hip and knee pain   ROSUVASTATIN (CRESTOR) 5 MG TABLET    Take 1 tablet (5 mg total) by mouth daily.   TDAP (BOOSTRIX) 5-2.5-18.5 LF-MCG/0.5 INJECTION    Inject 0.5 mLs into the muscle once.  Modified Medications   No medications on file  Discontinued Medications   No medications on file     Physical Exam:  Vitals:   12/18/17 0826  BP: (!) 142/70  Pulse: 65  Temp: 98.6 F (37 C)  TempSrc: Oral  SpO2: 97%  Weight: 130 lb (59 kg)  Height: 5\' 3"  (1.6 m)   Body mass index is 23.03 kg/m.  Physical Exam  Constitutional: She is oriented to person, place, and time. No distress.  Frail elderly female  HENT:  Head: Normocephalic and atraumatic.  Mouth/Throat: Oropharynx is clear and moist.  HOH  Eyes: Pupils are equal, round, and reactive to light. Conjunctivae are normal.    Neck: Normal range of motion. Neck supple.  Cardiovascular: Normal rate and regular rhythm.  Murmur heard. Pulmonary/Chest: Effort normal and breath sounds normal. She exhibits no mass. Right breast exhibits no inverted nipple and no mass. Left breast exhibits no inverted nipple and no mass.  Abdominal: Soft. Bowel sounds are normal.  Genitourinary: Rectum normal. Rectal exam shows no external hemorrhoid, no internal hemorrhoid and guaiac negative stool.  Musculoskeletal: She exhibits edema (1+ bilaterally). She exhibits no tenderness.  Neurological: She is alert and oriented to person, place, and time.  Skin: Skin is warm and dry. She is not diaphoretic.  Psychiatric: She has a normal mood and affect.    Labs reviewed: Basic Metabolic Panel: Recent Labs    02/12/17 1626 06/20/17 0854 09/25/17 0959  NA 143 142 144  K 4.3 4.4 4.3  CL 104 104 104  CO2  --  30 32  GLUCOSE 92 108* 110*  BUN 21* 16 20  CREATININE 1.00 1.11* 0.98*  CALCIUM  --  9.5 9.8   Liver Function Tests: Recent Labs    06/20/17 0854 09/25/17 0959  AST 16  --   ALT 13 18  BILITOT 0.7  --   PROT 6.3  --    No results for input(s): LIPASE, AMYLASE in the last 8760 hours. No results for input(s): AMMONIA in the last 8760 hours. CBC: Recent Labs    02/12/17 1553 02/12/17 1626 06/20/17 0854 08/15/17 1047  WBC 5.2  --  4.6 4.6  NEUTROABS 2.9  --  2,484 2,282  HGB 13.9 15.3* 13.6 13.8  HCT 42.2 45.0 40.9 40.9  MCV 86.3  --  85.4 84.7  PLT 215  --  193 178   Lipid Panel: Recent Labs    06/20/17 0854 09/25/17 0959  CHOL 218* 201*  HDL 82 74  LDLCALC 110* 107*  TRIG 138 108  CHOLHDL 2.7 2.7   TSH: No results for input(s): TSH in the last 8760 hours. A1C: Lab Results  Component Value Date   HGBA1C 6.2 (H) 09/25/2017     Assessment/Plan 1. Type 2 diabetes mellitus with complication, without long-term current use of insulin (HCC) Last A1c 6.2 2 months ago. Will continue current regimen,  without low blood sugars.  - glipiZIDE (GLUCOTROL) 5 MG tablet; Take 0.5 tablets (2.5 mg total) by mouth daily before breakfast.  Dispense: 45 tablet; Refill: 1  2. Mixed hyperlipidemia Continues on crestor 5 mg daily, LDL at goal on recent lab work.  3. Essential hypertension Stable, discussed reduction of sodium in diet. Will continue lisinopril 5 mg by mouth daily  4. Primary osteoarthritis of right knee -ongoing, uses mobic with tylenol as needed and muscle rub with good relief  - meloxicam (MOBIC) 7.5 MG tablet; Take 1 tablet (7.5 mg total) by mouth  daily. For hip and knee pain  Dispense: 90 tablet; Refill: 1  5. Need for pneumococcal vaccination - Pneumococcal conjugate vaccine 13-valent  6. Edema of right lower extremity due to peripheral venous insufficiency -encouraged to cut back on sodium in diet -elevation of LE -use compression hose  Next appt: 3 months with labs at time of visit.  Janene HarveyJessica K. Biagio BorgEubanks, AGNP  [facility] [phone]

## 2017-12-24 ENCOUNTER — Ambulatory Visit: Payer: Medicare Other | Admitting: Nurse Practitioner

## 2018-01-28 ENCOUNTER — Ambulatory Visit: Payer: Medicare Other | Admitting: Internal Medicine

## 2018-04-09 DIAGNOSIS — Z23 Encounter for immunization: Secondary | ICD-10-CM | POA: Diagnosis not present

## 2018-05-12 ENCOUNTER — Other Ambulatory Visit: Payer: Self-pay | Admitting: Nurse Practitioner

## 2018-05-12 DIAGNOSIS — M1711 Unilateral primary osteoarthritis, right knee: Secondary | ICD-10-CM

## 2018-05-16 ENCOUNTER — Other Ambulatory Visit: Payer: Self-pay | Admitting: Nurse Practitioner

## 2018-05-16 DIAGNOSIS — M1711 Unilateral primary osteoarthritis, right knee: Secondary | ICD-10-CM

## 2018-05-19 NOTE — Telephone Encounter (Signed)
Spoke with patient, patient is overdue for an appointment. Patient asked that I call her daughter to schedule appointment.  I called Monica Cohen and left message on voicemail for her to return call and schedule appointment for routine follow-up. Patient was instructed to schedule 3 month follow-up in July (appointment was never scheduled)

## 2018-05-22 ENCOUNTER — Ambulatory Visit (INDEPENDENT_AMBULATORY_CARE_PROVIDER_SITE_OTHER): Payer: Medicare Other | Admitting: Nurse Practitioner

## 2018-05-22 ENCOUNTER — Encounter: Payer: Self-pay | Admitting: Nurse Practitioner

## 2018-05-22 VITALS — BP 122/78 | HR 73 | Temp 97.5°F | Ht 63.0 in | Wt 127.0 lb

## 2018-05-22 DIAGNOSIS — E118 Type 2 diabetes mellitus with unspecified complications: Secondary | ICD-10-CM

## 2018-05-22 DIAGNOSIS — E782 Mixed hyperlipidemia: Secondary | ICD-10-CM

## 2018-05-22 DIAGNOSIS — H9193 Unspecified hearing loss, bilateral: Secondary | ICD-10-CM | POA: Diagnosis not present

## 2018-05-22 DIAGNOSIS — M1711 Unilateral primary osteoarthritis, right knee: Secondary | ICD-10-CM | POA: Diagnosis not present

## 2018-05-22 DIAGNOSIS — I10 Essential (primary) hypertension: Secondary | ICD-10-CM

## 2018-05-22 MED ORDER — MELOXICAM 7.5 MG PO TABS: ORAL_TABLET | ORAL | 1 refills | Status: DC

## 2018-05-22 NOTE — Patient Instructions (Addendum)
We will try to help set you up with an another audiologist- please have your daughter call our office to help set this up  Also you are due to for a bone density scan - please call [facility] to schedule appt  [address] [place], [place] [address]  [phone]  To follow up in 4 months for routine follow up, we will need fasting blood work- can come in prior to appt if someone will bring you for labs or we can get this at your appt.

## 2018-05-22 NOTE — Progress Notes (Signed)
Careteam: Patient Care Team: Monica Cohen, Alessandro Griep K, NP as PCP - General (Geriatric Medicine) Julio SicksPool, Henry, MD as Consulting Physician (Neurosurgery)  Advanced Directive information    No Known Allergies  Chief Complaint  Patient presents with  . Medical Management of Chronic Issues    5 month follow-up, A1c due. Patient not fasting if additional labs due today   . Medication Refill    Meloxicam refills- 90 day supply  . Health Maintenance    Discuss need for eye exam, Shingrix, TDaP, and bond density      HPI: Patient is a 84 y.o. female seen in the office today for routine follow up.  Reports ongoing issues with her eyes states that she is going to see an ophthalmologist but unsure of the name.   DM- currently taking glipizide 5 mg daily, A1c 6.2 in April, reports no low blood sugars.  reports she feels good- unsure what her blood sugar levels are  htn- controlled on lisinopril 5 mg daily   Osteoarthritis- taking mobic 7.5 for knee pain which helps however when the weather gets bad knee swells and is very painful. Currently pain is 5/10. No trouble walking at this time.  Will get up in the morning and takes tylenol which normally helps her throughout the day  Hyperlipidemia- LDL 107 in April 2019, currently on crestor 5 mg by mouth daily    Review of Systems:  Review of Systems  Constitutional: Negative for chills, fever and weight loss.  HENT: Positive for hearing loss (wears hearing aid). Negative for tinnitus.   Eyes:       Right eye pain- going to ophthalmologist   Respiratory: Negative for cough, sputum production and shortness of breath.   Cardiovascular: Positive for leg swelling. Negative for chest pain and palpitations.  Gastrointestinal: Negative for abdominal pain, constipation, diarrhea and heartburn.  Genitourinary: Negative for dysuria, frequency and urgency.  Musculoskeletal: Positive for back pain and joint pain (bilateral knee pain). Negative for  falls and myalgias.  Skin: Negative.   Neurological: Negative for dizziness and headaches.  Psychiatric/Behavioral: Negative for depression and memory loss. The patient does not have insomnia.     Past Medical History:  Diagnosis Date  . Arthritis   . DDD (degenerative disc disease), lumbosacral    with chronic back pain and radiculopathy with abnormal MRI  . Diabetes mellitus without complication (HCC)   . Hypertension    Past Surgical History:  Procedure Laterality Date  . APPENDECTOMY    . HYSTEROTOMY     Social History:   reports that she has never smoked. She has never used smokeless tobacco. She reports that she does not drink alcohol or use drugs.  Family History  Problem Relation Age of Onset  . Diabetes Mother [id number]  . Diabetes Brother [phone]  . Ovarian cancer Sister 245  . Heart attack Sister 763  . Diabetes Sister [phone]    Medications: Patient's Medications  New Prescriptions   No medications on file  Previous Medications   ACETAMINOPHEN (TYLENOL) 500 MG TABLET    Take 2 tablets (1,000 mg total) by mouth every 8 (eight) hours as needed.   GLIPIZIDE (GLUCOTROL) 5 MG TABLET    Take 0.5 tablets (2.5 mg total) by mouth daily before breakfast.   LISINOPRIL (PRINIVIL,ZESTRIL) 5 MG TABLET    Take 1 tablet (5 mg total) by mouth daily.   MELOXICAM (MOBIC) 7.5 MG TABLET    TAKE 1 TABLET BY MOUTH DAILY FOR HIP/KNEE PAIN  ROSUVASTATIN (CRESTOR) 5 MG TABLET    Take 1 tablet (5 mg total) by mouth daily.  Modified Medications   No medications on file  Discontinued Medications   No medications on file     Physical Exam:  Vitals:   05/22/18 1039  BP: 122/78  Pulse: 73  Temp: (!) 97.5 F (36.4 C)  TempSrc: Oral  SpO2: 97%  Weight: 127 lb (57.6 kg)  Height: 5\' 3"  (1.6 m)   Body mass index is 22.5 kg/m.  Physical Exam  Constitutional: She is oriented to person, place, and time. No distress.  Frail elderly female  HENT:  Head: Normocephalic and atraumatic.    Mouth/Throat: Oropharynx is clear and moist.  HOH  Eyes: Pupils are equal, round, and reactive to light. Conjunctivae are normal.  Neck: Normal range of motion. Neck supple.  Cardiovascular: Normal rate and regular rhythm.  Murmur heard. Pulmonary/Chest: Effort normal and breath sounds normal.  Abdominal: Soft. Bowel sounds are normal.  Genitourinary: Rectum normal. Rectal exam shows no external hemorrhoid, no internal hemorrhoid and guaiac negative stool.  Musculoskeletal: She exhibits no edema or tenderness.  Neurological: She is alert and oriented to person, place, and time.  Skin: Skin is warm and dry. She is not diaphoretic.  Psychiatric: She has a normal mood and affect.    Labs reviewed: Basic Metabolic Panel: Recent Labs    06/20/17 0854 09/25/17 0959  NA 142 144  K 4.4 4.3  CL 104 104  CO2 30 32  GLUCOSE 108* 110*  BUN 16 20  CREATININE 1.11* 0.98*  CALCIUM 9.5 9.8   Liver Function Tests: Recent Labs    06/20/17 0854 09/25/17 0959  AST 16  --   ALT 13 18  BILITOT 0.7  --   PROT 6.3  --    No results for input(s): LIPASE, AMYLASE in the last 8760 hours. No results for input(s): AMMONIA in the last 8760 hours. CBC: Recent Labs    06/20/17 0854 08/15/17 1047  WBC 4.6 4.6  NEUTROABS 2,484 2,282  HGB 13.6 13.8  HCT 40.9 40.9  MCV 85.4 84.7  PLT 193 178   Lipid Panel: Recent Labs    06/20/17 0854 09/25/17 0959  CHOL 218* 201*  HDL 82 74  LDLCALC 110* 107*  TRIG 138 108  CHOLHDL 2.7 2.7   TSH: No results for input(s): TSH in the last 8760 hours. A1C: Lab Results  Component Value Date   HGBA1C 6.2 (H) 09/25/2017     Assessment/Plan 1. Primary osteoarthritis of right knee -discussed reduction of mobic. Tylenol PRN a better alternative due to adverse effects of NSAIDs - COMPLETE METABOLIC PANEL WITH GFR - CBC with Differential/Platelets  2. Mixed hyperlipidemia Continues on crestor 5 mg daily with dietary modifications  - COMPLETE  METABOLIC PANEL WITH GFR  3. Essential hypertension -stable on lisinopril, low sodium diet encouraged  - COMPLETE METABOLIC PANEL WITH GFR - CBC with Differential/Platelets  4. Type 2 diabetes mellitus with complication, without long-term current use of insulin (HCC) -continues on glipizide, no hypoglycemia noted.  - COMPLETE METABOLIC PANEL WITH GFR - Hemoglobin A1c  5. HOH Hearing aides do not fit properly, discussed with her about going to a different audiologist for fitting. She plans to talk with her daughter in regards to this.   Next appt: 10/02/2018 Janene Harvey. Biagio Borg  [facility] [phone]

## 2018-05-23 ENCOUNTER — Other Ambulatory Visit: Payer: Self-pay

## 2018-05-23 LAB — CBC WITH DIFFERENTIAL/PLATELET
Basophils Absolute: 30 cells/uL (ref 0–200)
Basophils Relative: 0.8 %
EOS PCT: 2.1 %
Eosinophils Absolute: 80 cells/uL (ref 15–500)
HCT: 43.6 % (ref 35.0–45.0)
Hemoglobin: 14.2 g/dL (ref 11.7–15.5)
Lymphs Abs: 1254 cells/uL (ref 850–3900)
MCH: 27.7 pg (ref 27.0–33.0)
MCHC: 32.6 g/dL (ref 32.0–36.0)
MCV: 85 fL (ref 80.0–100.0)
MPV: 11.3 fL (ref 7.5–12.5)
Monocytes Relative: 7.9 %
Neutro Abs: 2136 cells/uL (ref 1500–7800)
Neutrophils Relative %: 56.2 %
Platelets: 204 10*3/uL (ref 140–400)
RBC: 5.13 10*6/uL — ABNORMAL HIGH (ref 3.80–5.10)
RDW: 13.1 % (ref 11.0–15.0)
Total Lymphocyte: 33 %
WBC mixed population: 300 cells/uL (ref 200–950)
WBC: 3.8 10*3/uL (ref 3.8–10.8)

## 2018-05-23 LAB — COMPLETE METABOLIC PANEL WITH GFR
AG Ratio: 1.5 (calc) (ref 1.0–2.5)
ALT: 13 U/L (ref 6–29)
AST: 20 U/L (ref 10–35)
Albumin: 4.2 g/dL (ref 3.6–5.1)
Alkaline phosphatase (APISO): 69 U/L (ref 33–130)
BILIRUBIN TOTAL: 0.8 mg/dL (ref 0.2–1.2)
BUN/Creatinine Ratio: 10 (calc) (ref 6–22)
BUN: 11 mg/dL (ref 7–25)
CO2: 31 mmol/L (ref 20–32)
Calcium: 9.9 mg/dL (ref 8.6–10.4)
Chloride: 106 mmol/L (ref 98–110)
Creat: 1.14 mg/dL — ABNORMAL HIGH (ref 0.60–0.88)
GFR, EST AFRICAN AMERICAN: 49 mL/min/{1.73_m2} — AB (ref >=60)
GFR, Est Non African American: 42 mL/min/{1.73_m2} — ABNORMAL LOW (ref >=60)
Globulin: 2.8 g/dL (calc) (ref 1.9–3.7)
Glucose, Bld: 110 mg/dL — ABNORMAL HIGH (ref 65–99)
Potassium: 4.5 mmol/L (ref 3.5–5.3)
Sodium: 143 mmol/L (ref 135–146)
TOTAL PROTEIN: 7 g/dL (ref 6.1–8.1)

## 2018-05-23 LAB — HEMOGLOBIN A1C
HEMOGLOBIN A1C: 5.9 %{Hb} — AB (ref <5.7)
MEAN PLASMA GLUCOSE: 123 (calc)
eAG (mmol/L): 6.8 (calc)

## 2018-08-11 ENCOUNTER — Encounter: Payer: Self-pay | Admitting: Family

## 2018-10-02 ENCOUNTER — Other Ambulatory Visit: Payer: Self-pay

## 2018-10-02 ENCOUNTER — Ambulatory Visit (INDEPENDENT_AMBULATORY_CARE_PROVIDER_SITE_OTHER): Payer: Medicare Other | Admitting: Nurse Practitioner

## 2018-10-02 ENCOUNTER — Encounter: Payer: Self-pay | Admitting: Nurse Practitioner

## 2018-10-02 DIAGNOSIS — M1711 Unilateral primary osteoarthritis, right knee: Secondary | ICD-10-CM

## 2018-10-02 DIAGNOSIS — E782 Mixed hyperlipidemia: Secondary | ICD-10-CM | POA: Diagnosis not present

## 2018-10-02 DIAGNOSIS — Z23 Encounter for immunization: Secondary | ICD-10-CM

## 2018-10-02 DIAGNOSIS — I10 Essential (primary) hypertension: Secondary | ICD-10-CM | POA: Diagnosis not present

## 2018-10-02 DIAGNOSIS — E118 Type 2 diabetes mellitus with unspecified complications: Secondary | ICD-10-CM

## 2018-10-02 MED ORDER — TETANUS-DIPHTH-ACELL PERTUSSIS 5-2-15.5 LF-MCG/0.5 IM SUSP: 0.5000 mL | Freq: Once | INTRAMUSCULAR | 0 refills | Status: AC

## 2018-10-02 MED ORDER — ROSUVASTATIN CALCIUM 5 MG PO TABS: 5.0000 mg | ORAL_TABLET | Freq: Every day | ORAL | 3 refills | Status: AC

## 2018-10-02 NOTE — Progress Notes (Signed)
This service is provided via telemedicine  No vital signs collected/recorded due to the encounter was a telemedicine visit.   Location of patient (ex: home, work):  home  Patient consents to a telephone visit:  yes  Location of the provider (ex: office, home):  Office    Names of all persons participating in the telemedicine service and their role in the encounter:  Asher Muir CMA, Abbey Chatters NP, Cecille Aver  Time spent on call:  Asher Muir CMA spent  6  Minutes with patient on phone    Virtual Visit via Telephone Note  I connected with Lashell Ardolino on 10/02/18 at  8:30 AM EDT by telephone and verified that I am speaking with the correct person using two identifiers.   I discussed the limitations, risks, security and privacy concerns of performing an evaluation and management service by telephone and the availability of in person appointments. I also discussed with the patient that there may be a patient responsible charge related to this service. The patient expressed understanding and agreed to proceed.      Careteam: Patient Care Team: Sharon Seller, NP as PCP - General (Geriatric Medicine) [name], MD as Consulting Physician (Neurosurgery)  Advanced Directive information    No Known Allergies  Chief Complaint  Patient presents with  . Medical Management of Chronic Issues    4 month follow up fasting labs  . Quality Metric Gaps    Tetanus Vaccine sent to patient's pharmacy      HPI: Patient is a 85 y.o. female  for routine follow up.    DM- currently taking glipizide 2.5 mg daily, A1c 5.9 in dec, reports no low blood sugars. No hypoglycemic episode that she is aware of.   htn- does not check blood pressure at home.  controlled on lisinopril 5 mg daily   Osteoarthritis- previously on mobic but stopped due to worsening renal function. Reports knee is better when weather is good. Takes tylenol as needed for pain.   Hyperlipidemia-  LDL 107 in April 2019, currently on crestor 5 mg by mouth daily- out of crestor and has been out for several weeks.   Review of Systems:  Review of Systems  Constitutional: Negative for chills, fever and malaise/fatigue.  HENT: Positive for hearing loss. Negative for congestion.   Respiratory: Negative for cough and shortness of breath.   Cardiovascular: Negative for chest pain and leg swelling.  Gastrointestinal: Negative for abdominal pain, constipation, diarrhea and heartburn.  Genitourinary: Negative for dysuria, frequency and urgency.  Musculoskeletal: Positive for falls and joint pain (knees). Negative for myalgias.  Skin: Negative.   Neurological: Negative for dizziness, weakness and headaches.  Endo/Heme/Allergies: Positive for environmental allergies.  Psychiatric/Behavioral: The patient does not have insomnia.     Past Medical History:  Diagnosis Date  . Arthritis   . DDD (degenerative disc disease), lumbosacral    with chronic back pain and radiculopathy with abnormal MRI  . Diabetes mellitus without complication (HCC)   . Hypertension    Past Surgical History:  Procedure Laterality Date  . APPENDECTOMY    . HYSTEROTOMY     Social History:   reports that she has never smoked. She has never used smokeless tobacco. She reports that she does not drink alcohol or use drugs.  Family History  Problem Relation Age of Onset  . Diabetes Mother 67  . Diabetes Brother 41  . Ovarian cancer Sister 77  . Heart attack Sister 90  .  Diabetes Sister [id number]    Medications: Patient's Medications  New Prescriptions   No medications on file  Previous Medications   ACETAMINOPHEN (TYLENOL) 500 MG TABLET    Take 2 tablets (1,000 mg total) by mouth every 8 (eight) hours as needed.   GLIPIZIDE (GLUCOTROL) 5 MG TABLET    Take 0.5 tablets (2.5 mg total) by mouth daily before breakfast.   LISINOPRIL (PRINIVIL,ZESTRIL) 5 MG TABLET    Take 1 tablet (5 mg total) by mouth daily.    ROSUVASTATIN (CRESTOR) 5 MG TABLET    Take 1 tablet (5 mg total) by mouth daily.   TDAP (ADACEL) 10-17-13.5 LF-MCG/0.5 INJECTION    Inject 0.5 mLs into the muscle once.  Modified Medications   No medications on file  Discontinued Medications   No medications on file     Physical Exam: unable due to tele-visit   Labs reviewed: Basic Metabolic Panel: Recent Labs    05/22/18 1120  NA 143  K 4.5  CL 106  CO2 31  GLUCOSE 110*  BUN 11  CREATININE 1.14*  CALCIUM 9.9   Liver Function Tests: Recent Labs    05/22/18 1120  AST 20  ALT 13  BILITOT 0.8  PROT 7.0   No results for input(s): LIPASE, AMYLASE in the last 8760 hours. No results for input(s): AMMONIA in the last 8760 hours. CBC: Recent Labs    05/22/18 1120  WBC 3.8  NEUTROABS 2,136  HGB 14.2  HCT 43.6  MCV 85.0  PLT 204   Lipid Panel: No results for input(s): CHOL, HDL, LDLCALC, TRIG, CHOLHDL, LDLDIRECT in the last 8760 hours. TSH: No results for input(s): TSH in the last 8760 hours. A1C: Lab Results  Component Value Date   HGBA1C 5.9 (H) 05/22/2018     Assessment/Plan 1. Need for diphtheria-tetanus-pertussis (Tdap) vaccine - Tdap (ADACEL) 10-17-13.5 LF-MCG/0.5 injection; Inject 0.5 mLs into the muscle once for 1 dose.  Dispense: 0.5 mL; Refill: 0  2. Mixed hyperlipidemia - rosuvastatin (CRESTOR) 5 MG tablet; Take 1 tablet (5 mg total) by mouth daily.  Dispense: 90 tablet; Refill: 3 - Lipid Panel; Future - COMPLETE METABOLIC PANEL WITH GFR; Future  3. Type 2 diabetes mellitus with complication, without long-term current use of insulin (HCC) -to stop glipizide and we will continue to monitor A1c. To continue low sugar diet "diabetic", routine foot care/monitoring and to keep up with diabetic eye exams through ophthalmology  - Hemoglobin A1c; Future  4. Primary osteoarthritis of right knee -has stopped mobic, continues tylenol as needed which controls pain.   5. Essential hypertension Has not taken  blood pressure recently. Continues on lisinopril.   Next appt: will need lab work soon however transportation is an issue, daughter called and will coordinate with pt to get her here and 3 month routine follow up.   Janene HarveyJessica K. Biagio BorgEubanks, AGNP  [facility] [phone]  Follow Up Instructions:    I discussed the assessment and treatment plan with the patient. The patient was provided an opportunity to ask questions and all were answered. The patient agreed with the plan and demonstrated an understanding of the instructions.   The patient was advised to call back or seek an in-person evaluation if the symptoms worsen or if the condition fails to improve as anticipated.  I provided 12 minutes of non-face-to-face time during this encounter.

## 2018-10-02 NOTE — Patient Instructions (Addendum)
To STOP glipizide, will monitor A1c Please call and schedule FASTING lab work and 3 month follow up.

## 2018-10-06 ENCOUNTER — Other Ambulatory Visit: Payer: Self-pay

## 2018-10-09 ENCOUNTER — Other Ambulatory Visit: Payer: Self-pay

## 2018-10-09 ENCOUNTER — Other Ambulatory Visit: Payer: Medicare Other

## 2018-10-09 DIAGNOSIS — E782 Mixed hyperlipidemia: Secondary | ICD-10-CM | POA: Diagnosis not present

## 2018-10-09 DIAGNOSIS — E118 Type 2 diabetes mellitus with unspecified complications: Secondary | ICD-10-CM | POA: Diagnosis not present

## 2018-10-10 LAB — HEMOGLOBIN A1C
Hgb A1c MFr Bld: 6.1 % of total Hgb — ABNORMAL HIGH (ref <5.7)
Mean Plasma Glucose: 128 (calc)
eAG (mmol/L): 7.1 (calc)

## 2018-10-10 LAB — LIPID PANEL
Cholesterol: 193 mg/dL (ref <200)
HDL: 72 mg/dL (ref >=50)
LDL Cholesterol (Calc): 100 mg/dL (calc) — ABNORMAL HIGH
Non-HDL Cholesterol (Calc): 121 mg/dL (calc) (ref <130)
Total CHOL/HDL Ratio: 2.7 (calc) (ref <5.0)
Triglycerides: 112 mg/dL (ref <150)

## 2018-10-10 LAB — COMPLETE METABOLIC PANEL WITH GFR
AG Ratio: 1.7 (calc) (ref 1.0–2.5)
ALT: 16 U/L (ref 6–29)
AST: 19 U/L (ref 10–35)
Albumin: 4.3 g/dL (ref 3.6–5.1)
Alkaline phosphatase (APISO): 55 U/L (ref 37–153)
BUN/Creatinine Ratio: 22 (calc) (ref 6–22)
BUN: 25 mg/dL (ref 7–25)
CO2: 28 mmol/L (ref 20–32)
Calcium: 9.7 mg/dL (ref 8.6–10.4)
Chloride: 105 mmol/L (ref 98–110)
Creat: 1.15 mg/dL — ABNORMAL HIGH (ref 0.60–0.88)
GFR, Est African American: 49 mL/min/{1.73_m2} — ABNORMAL LOW (ref >=60)
GFR, Est Non African American: 42 mL/min/{1.73_m2} — ABNORMAL LOW (ref >=60)
Globulin: 2.6 g/dL (calc) (ref 1.9–3.7)
Glucose, Bld: 112 mg/dL — ABNORMAL HIGH (ref 65–99)
Potassium: 4.6 mmol/L (ref 3.5–5.3)
Sodium: 142 mmol/L (ref 135–146)
Total Bilirubin: 0.8 mg/dL (ref 0.2–1.2)
Total Protein: 6.9 g/dL (ref 6.1–8.1)

## 2018-11-17 ENCOUNTER — Encounter: Payer: Medicare Other | Admitting: Family

## 2018-11-17 ENCOUNTER — Ambulatory Visit: Payer: Self-pay

## 2018-11-18 ENCOUNTER — Encounter: Payer: Self-pay | Admitting: Family

## 2018-11-18 ENCOUNTER — Other Ambulatory Visit: Payer: Self-pay

## 2018-11-18 ENCOUNTER — Ambulatory Visit (INDEPENDENT_AMBULATORY_CARE_PROVIDER_SITE_OTHER): Payer: Medicare Other | Admitting: Family

## 2018-11-18 DIAGNOSIS — Z23 Encounter for immunization: Secondary | ICD-10-CM

## 2018-11-18 DIAGNOSIS — Z0000 Encounter for general adult medical examination without abnormal findings: Secondary | ICD-10-CM

## 2018-11-18 MED ORDER — TETANUS-DIPHTH-ACELL PERTUSSIS 5-2-15.5 LF-MCG/0.5 IM SUSP: 0.5000 mL | Freq: Once | INTRAMUSCULAR | 0 refills | Status: AC

## 2018-11-18 NOTE — Patient Instructions (Addendum)
Monica Cohen , Thank you for taking time to come for your Medicare Wellness Visit. I appreciate your ongoing commitment to your health goals. Please review the following plan we discussed and let me know if I can assist you in the future.   Screening recommendations/referrals: Colonoscopy: N/A  Mammogram: N/A  Bone Density: completed  Recommended yearly ophthalmology/optometry visit for glaucoma screening and checkup Recommended yearly dental visit for hygiene and checkup  Vaccinations: Influenza vaccine: Up to date  Pneumococcal vaccine: Up to date  Tdap vaccine: Ordered placed this visit  Shingles vaccine: Declined     Advanced directives: Has HCPOA   Conditions/risks identified: Advanced age > 31 yrs,Type 2 DM,Hypertension,Hyperlipidemia   Next appointment: 1 year    Preventive Care 8 Years and Older, Female Preventive care refers to lifestyle choices and visits with your health care provider that can promote health and wellness. What does preventive care include?  A yearly physical exam. This is also called an annual well check.  Dental exams once or twice a year.  Routine eye exams. Ask your health care provider how often you should have your eyes checked.  Personal lifestyle choices, including:  Daily care of your teeth and gums.  Regular physical activity.  Eating a healthy diet.  Avoiding tobacco and drug use.  Limiting alcohol use.  Practicing safe sex.  Taking low-dose aspirin every day.  Taking vitamin and mineral supplements as recommended by your health care provider. What happens during an annual well check? The services and screenings done by your health care provider during your annual well check will depend on your age, overall health, lifestyle risk factors, and family history of disease. Counseling  Your health care provider may ask you questions about your:  Alcohol use.  Tobacco use.  Drug use.  Emotional well-being.  Home and  relationship well-being.  Sexual activity.  Eating habits.  History of falls.  Memory and ability to understand (cognition).  Work and work Astronomer.  Reproductive health. Screening  You may have the following tests or measurements:  Height, weight, and BMI.  Blood pressure.  Lipid and cholesterol levels. These may be checked every 5 years, or more frequently if you are over 36 years old.  Skin check.  Lung cancer screening. You may have this screening every year starting at age 12 if you have a 30-pack-year history of smoking and currently smoke or have quit within the past 15 years.  Fecal occult blood test (FOBT) of the stool. You may have this test every year starting at age 14.  Flexible sigmoidoscopy or colonoscopy. You may have a sigmoidoscopy every 5 years or a colonoscopy every 10 years starting at age 10.  Hepatitis C blood test.  Hepatitis B blood test.  Sexually transmitted disease (STD) testing.  Diabetes screening. This is done by checking your blood sugar (glucose) after you have not eaten for a while (fasting). You may have this done every 1-3 years.  Bone density scan. This is done to screen for osteoporosis. You may have this done starting at age 60.  Mammogram. This may be done every 1-2 years. Talk to your health care provider about how often you should have regular mammograms. Talk with your health care provider about your test results, treatment options, and if necessary, the need for more tests. Vaccines  Your health care provider may recommend certain vaccines, such as:  Influenza vaccine. This is recommended every year.  Tetanus, diphtheria, and acellular pertussis (Tdap, Td) vaccine. You  may need a Td booster every 10 years.  Zoster vaccine. You may need this after age 50.  Pneumococcal 13-valent conjugate (PCV13) vaccine. One dose is recommended after age 56.  Pneumococcal polysaccharide (PPSV23) vaccine. One dose is recommended after  age 33. Talk to your health care provider about which screenings and vaccines you need and how often you need them. This information is not intended to replace advice given to you by your health care provider. Make sure you discuss any questions you have with your health care provider. Document Released: 07/01/2015 Document Revised: 02/22/2016 Document Reviewed: 04/05/2015 Elsevier Interactive Patient Education  2017 [place] Prevention in the Home Falls can cause injuries. They can happen to people of all ages. There are many things you can do to make your home safe and to help prevent falls. What can I do on the outside of my home?  Regularly fix the edges of walkways and driveways and fix any cracks.  Remove anything that might make you trip as you walk through a door, such as a raised step or threshold.  Trim any bushes or trees on the path to your home.  Use bright outdoor lighting.  Clear any walking paths of anything that might make someone trip, such as rocks or tools.  Regularly check to see if handrails are loose or broken. Make sure that both sides of any steps have handrails.  Any raised decks and porches should have guardrails on the edges.  Have any leaves, snow, or ice cleared regularly.  Use sand or salt on walking paths during winter.  Clean up any spills in your garage right away. This includes oil or grease spills. What can I do in the bathroom?  Use night lights.  Install grab bars by the toilet and in the tub and shower. Do not use towel bars as grab bars.  Use non-skid mats or decals in the tub or shower.  If you need to sit down in the shower, use a plastic, non-slip stool.  Keep the floor dry. Clean up any water that spills on the floor as soon as it happens.  Remove soap buildup in the tub or shower regularly.  Attach bath mats securely with double-sided non-slip rug tape.  Do not have throw rugs and other things on the floor that can  make you trip. What can I do in the bedroom?  Use night lights.  Make sure that you have a light by your bed that is easy to reach.  Do not use any sheets or blankets that are too big for your bed. They should not hang down onto the floor.  Have a firm chair that has side arms. You can use this for support while you get dressed.  Do not have throw rugs and other things on the floor that can make you trip. What can I do in the kitchen?  Clean up any spills right away.  Avoid walking on wet floors.  Keep items that you use a lot in easy-to-reach places.  If you need to reach something above you, use a strong step stool that has a grab bar.  Keep electrical cords out of the way.  Do not use floor polish or wax that makes floors slippery. If you must use wax, use non-skid floor wax.  Do not have throw rugs and other things on the floor that can make you trip. What can I do with my stairs?  Do not leave any items  on the stairs.  Make sure that there are handrails on both sides of the stairs and use them. Fix handrails that are broken or loose. Make sure that handrails are as long as the stairways.  Check any carpeting to make sure that it is firmly attached to the stairs. Fix any carpet that is loose or worn.  Avoid having throw rugs at the top or bottom of the stairs. If you do have throw rugs, attach them to the floor with carpet tape.  Make sure that you have a light switch at the top of the stairs and the bottom of the stairs. If you do not have them, ask someone to add them for you. What else can I do to help prevent falls?  Wear shoes that:  Do not have high heels.  Have rubber bottoms.  Are comfortable and fit you well.  Are closed at the toe. Do not wear sandals.  If you use a stepladder:  Make sure that it is fully opened. Do not climb a closed stepladder.  Make sure that both sides of the stepladder are locked into place.  Ask someone to hold it for you,  if possible.  Clearly mark and make sure that you can see:  Any grab bars or handrails.  First and last steps.  Where the edge of each step is.  Use tools that help you move around (mobility aids) if they are needed. These include:  Canes.  Walkers.  Scooters.  Crutches.  Turn on the lights when you go into a dark area. Replace any light bulbs as soon as they burn out.  Set up your furniture so you have a clear path. Avoid moving your furniture around.  If any of your floors are uneven, fix them.  If there are any pets around you, be aware of where they are.  Review your medicines with your doctor. Some medicines can make you feel dizzy. This can increase your chance of falling. Ask your doctor what other things that you can do to help prevent falls. This information is not intended to replace advice given to you by your health care provider. Make sure you discuss any questions you have with your health care provider. Document Released: 03/31/2009 Document Revised: 11/10/2015 Document Reviewed: 07/09/2014 Elsevier Interactive Patient Education  2017 [employer].

## 2018-11-18 NOTE — Progress Notes (Signed)
   This service is provided via telemedicine  No vital signs collected/recorded due to the encounter was a telemedicine visit.   Location of patient (ex: home, work):  Home   Patient consents to a telephone visit:  Yes   Location of the provider (ex: office, home):  Office   Name of any referring provider: Abbey Chatters, NP   Names of all persons participating in the telemedicine service and their role in the encounter:  Asher Muir CMA, Dinah Ngetich NP, Cecille Aver and Bonita Quin   Time spent on call:  Asher Muir spent 20  Minutes with patient on phone.

## 2018-11-18 NOTE — Progress Notes (Signed)
Subjective:   Monica AverSusan Cohen is a 85 y.o. female who presents for Medicare Annual (Subsequent) preventive examination.  Review of Systems:  Cardiac Risk Factors include: advanced age (>5655men, 40>65 women);diabetes mellitus;hypertension;dyslipidemia     Objective:     Vitals: There were no vitals taken for this visit.  There is no height or weight on file to calculate BMI.  Advanced Directives 11/18/2018 11/18/2018 12/18/2017 11/15/2017 09/25/2017 06/24/2017 05/16/2017  Does Patient Have a Medical Advance Directive? Yes Yes Yes Yes Yes Yes Yes  Type of Social research officer, governmentAdvance Directive Healthcare Power of Attorney Healthcare Power of Attorney Healthcare Power of [employer] Healthcare Power of [employer] Healthcare Power of [employer] Healthcare Power of [employer] Healthcare Power of [place];Living will  Does patient want to make changes to medical advance directive? - No - Patient declined - No - Patient declined - - -  Copy of Healthcare Power of Attorney in Chart? - Yes - validated most recent copy scanned in chart (See row information) Yes Yes Yes Yes No - copy requested  Would patient like information on creating a medical advance directive? - - - - - - -    Tobacco Social History   Tobacco Use  Smoking Status Never Smoker  Smokeless Tobacco Never Used     Counseling given: Not Answered   Clinical Intake:  Pre-visit preparation completed: No  Pain : No/denies pain     BMI - recorded: 22.5(BMI from previous visit ) Nutritional Status: BMI of 19-24  Normal Nutritional Risks: None Diabetes: Yes CBG done?: No Did pt. bring in CBG monitor from home?: (110's)  How often do you need to have someone help you when you read instructions, pamphlets, or other written materials from your doctor or pharmacy?: 3 - Sometimes What is the last grade level you completed in school?: 12 grade   Interpreter Needed?: No  Information entered by :: Jane Broughton FNP-C   Past Medical History:  Diagnosis Date  .  Arthritis   . DDD (degenerative disc disease), lumbosacral    with chronic back pain and radiculopathy with abnormal MRI  . Diabetes mellitus without complication (HCC)   . Hypertension    Past Surgical History:  Procedure Laterality Date  . APPENDECTOMY    . HYSTEROTOMY     Family History  Problem Relation Age of Onset  . Diabetes Mother 3089  . Diabetes Brother 2075  . Ovarian cancer Sister 6845  . Heart attack Sister 5863  . Diabetes Sister 5970   Social History   Socioeconomic History  . Marital status: Widowed    Spouse name: Not on file  . Number of children: Not on file  . Years of education: Not on file  . Highest education level: Not on file  Occupational History  . Not on file  Social Needs  . Financial resource strain: Not hard at all  . Food insecurity:    Worry: Never true    Inability: Never true  . Transportation needs:    Medical: No    Non-medical: No  Tobacco Use  . Smoking status: Never Smoker  . Smokeless tobacco: Never Used  Substance and Sexual Activity  . Alcohol use: No  . Drug use: No  . Sexual activity: Not Currently    Birth control/protection: None  Lifestyle  . Physical activity:    Days per week: 7 days    Minutes per session: 30 min  . Stress: Only a little  Relationships  . Social connections:  Talks on phone: More than three times a week    Gets together: More than three times a week    Attends religious service: More than 4 times per year    Active member of club or organization: No    Attends meetings of clubs or organizations: Never    Relationship status: Widowed  Other Topics Concern  . Not on file  Social History Narrative   Social History      Diet?      Do you drink/eat things with caffeine? yes      Marital status?        widow                           What year were you married?      Do you live in a house, apartment, assisted living, condo, trailer, etc.? apartment      Is it one or more stories? two       How many persons live in your home? 1      Do you have any pets in your home? (please list) 1 dog      Highest level of education completed? High school      Current or past profession:      Do you exercise?                 yes                     Type & how often? Walking 3 x a day      Advanced Directives      Do you have a living will? yes      Do you have a DNR form?                                  If not, do you want to discuss one? no      Do you have signed POA/HPOA for forms?       Functional Status      Do you have difficulty bathing or dressing yourself? no      Do you have difficulty preparing food or eating? no      Do you have difficulty managing your medications? yes      Do you have difficulty managing your finances? no      Do you have difficulty affording your medications? no    Outpatient Encounter Medications as of 11/18/2018  Medication Sig  . acetaminophen (TYLENOL) 500 MG tablet Take 2 tablets (1,000 mg total) by mouth every 8 (eight) hours as needed.  Marland Kitchen lisinopril (PRINIVIL,ZESTRIL) 5 MG tablet Take 1 tablet (5 mg total) by mouth daily.  . meloxicam (MOBIC) 7.5 MG tablet Take 7.5 mg by mouth daily.  . rosuvastatin (CRESTOR) 5 MG tablet Take 1 tablet (5 mg total) by mouth daily.  . Tdap (ADACEL) 10-17-13.5 LF-MCG/0.5 injection Inject 0.5 mLs into the muscle once for 1 dose.  . [DISCONTINUED] Tdap (ADACEL) 10-17-13.5 LF-MCG/0.5 injection Inject 0.5 mLs into the muscle once.   No facility-administered encounter medications on file as of 11/18/2018.     Activities of Daily Living In your present state of health, do you have any difficulty performing the following activities: 11/18/2018  Hearing? N  Vision? N  Difficulty concentrating or making decisions? Y  Comment Remembering   Walking or climbing  stairs? N  Dressing or bathing? N  Doing errands, shopping? Y  Comment Requires Facilities manager and eating ? N  Using the Toilet? N  In the  past six months, have you accidently leaked urine? N  Do you have problems with loss of bowel control? N  Managing your Medications? N  Housekeeping or managing your Housekeeping? N  Some recent data might be hidden    Patient Care Team: Sharon Seller, NP as PCP - General (Geriatric Medicine) [name], MD as Consulting Physician (Neurosurgery)    Assessment:   This is a routine wellness examination for Monica Cohen.  Exercise Activities and Dietary recommendations Current Exercise Habits: Home exercise routine, Type of exercise: walking, Time (Minutes): 30, Frequency (Times/Week): 7, Weekly Exercise (Minutes/Week): 210, Intensity: Mild, Exercise limited by: None identified  Goals   None     Fall Risk Fall Risk  11/18/2018 10/02/2018 05/22/2018 12/18/2017 11/15/2017  Falls in the past year? 0 0 0 No No  Number falls in past yr: 0 0 0 - -  Comment - - - - -  Injury with Fall? 0 0 0 - -   Is the patient's home free of loose throw rugs in walkways, pet beds, electrical cords, etc?   yes      Grab bars in the bathroom? yes      Handrails on the stairs?   yes      Adequate lighting?   yes  Depression Screen PHQ 2/9 Scores 11/18/2018 11/15/2017 06/24/2017 05/16/2017  PHQ - 2 Score 2 0 0 0  PHQ- 9 Score 2 - - -     Cognitive Function MMSE - Mini Mental State Exam 06/24/2017  Orientation to time 5  Orientation to Place 3  Registration 3  Attention/ Calculation 4  Recall 3  Language- name 2 objects 2  Language- repeat 1  Language- follow 3 step command 3  Language- read & follow direction 1  Write a sentence 1  Copy design 1  Total score 27     6CIT Screen 11/18/2018  What Year? 0 points  What month? 0 points  What time? 0 points  Count back from 20 4 points  Months in reverse 4 points  Repeat phrase 8 points  Total Score 16    Immunization History  Administered Date(s) Administered  . Influenza, High Dose Seasonal PF 03/05/2017, 04/09/2018  . Influenza,inj,Quad PF,6+ Mos  03/27/2016  . Pneumococcal Conjugate-13 12/18/2017    Qualifies for Shingles Vaccine? Decline   Screening Tests Health Maintenance  Topic Date Due  . OPHTHALMOLOGY EXAM  04/09/1938  . TETANUS/TDAP  04/10/1947  . DEXA SCAN  04/09/1993  . FOOT EXAM  06/24/2018  . PNA vac Low Risk Adult (2 of 2 - PPSV23) 12/19/2018  . INFLUENZA VACCINE  01/17/2019  . HEMOGLOBIN A1C  04/10/2019   Cancer Screenings: Lung: Low Dose CT Chest recommended if Age 25-80 years, 30 pack-year currently smoking OR have quit w/in 15 years. Patient does not qualify. Breast:  Up to date on Mammogram?N/A    Up to date of Bone Density/Dexa? Yes Colorectal: N/A   Additional Screenings: Hepatitis C Screening: Low Risk   Plan:  - Tdap vaccine ordered this visit   I have personally reviewed and noted the following in the patient's chart:   . Medical and social history . Use of alcohol, tobacco or illicit drugs  . Current medications and supplements . Functional ability and status . Nutritional status .  Physical activity . Advanced directives . List of other physicians . Hospitalizations, surgeries, and ER visits in previous 12 months . Vitals . Screenings to include cognitive, depression, and falls . Referrals and appointments  In addition, I have reviewed and discussed with patient certain preventive protocols, quality metrics, and best practice recommendations. A written personalized care plan for preventive services as well as general preventive health recommendations were provided to patient.    Caesar Bookman, NP  11/18/2018

## 2018-11-21 ENCOUNTER — Ambulatory Visit: Payer: Medicare Other | Admitting: Family

## 2019-01-06 ENCOUNTER — Other Ambulatory Visit: Payer: Self-pay

## 2019-01-06 ENCOUNTER — Ambulatory Visit (INDEPENDENT_AMBULATORY_CARE_PROVIDER_SITE_OTHER): Payer: Medicare Other | Admitting: Nurse Practitioner

## 2019-01-06 ENCOUNTER — Encounter: Payer: Self-pay | Admitting: Nurse Practitioner

## 2019-01-06 VITALS — BP 136/70 | HR 67 | Temp 98.2°F | Ht 63.0 in | Wt 130.0 lb

## 2019-01-06 DIAGNOSIS — E782 Mixed hyperlipidemia: Secondary | ICD-10-CM

## 2019-01-06 DIAGNOSIS — M1711 Unilateral primary osteoarthritis, right knee: Secondary | ICD-10-CM

## 2019-01-06 DIAGNOSIS — E118 Type 2 diabetes mellitus with unspecified complications: Secondary | ICD-10-CM

## 2019-01-06 DIAGNOSIS — I10 Essential (primary) hypertension: Secondary | ICD-10-CM | POA: Diagnosis not present

## 2019-01-06 DIAGNOSIS — E2839 Other primary ovarian failure: Secondary | ICD-10-CM | POA: Diagnosis not present

## 2019-01-06 DIAGNOSIS — M48061 Spinal stenosis, lumbar region without neurogenic claudication: Secondary | ICD-10-CM

## 2019-01-06 NOTE — Progress Notes (Signed)
Careteam: Patient Care Team: Lauree Chandler, NP as PCP - General (Geriatric Medicine) Earnie Larsson, MD as Consulting Physician (Neurosurgery)  Advanced Directive information Does Patient Have a Medical Advance Directive?: Yes, Type of Advance Directive: [place], Does patient want to make changes to medical advance directive?: No - Patient declined  No Known Allergies  Chief Complaint  Patient presents with  . Medical Management of Chronic Issues    3 month follow-up, foot exam due. Ongoing right knee pain   . Quality Metric Gaps    Discuss need for Dexa and eye exam  . Immunizations    Discuss need for TDaP      HPI: Patient is a 85 y.o. female seen in the office today for routine follow up.   DM- she has stopped glipizide, a1c 6.1 in April.   htn- controlled on lisinopril 5 mg daily   Osteoarthritis- worse to bilateral knee, previously on mobic but stopped due to worsening renal function. When weather good, knee is good. Sometimes it swells and 0unable to bend.  Takes tylenol as needed for pain which is effective sometimes but reports it is "bad" a lot of the time, effecting her walking down and up the stairs.   Hyperlipidemia- LDL 100 in April 2020, she had been out of her crestor 5 mg by mouth dailybut currently taking.  Review of Systems:  Review of Systems  Constitutional: Negative for chills, fever and malaise/fatigue.  HENT: Positive for hearing loss. Negative for congestion.   Respiratory: Negative for cough and shortness of breath.   Cardiovascular: Negative for chest pain and leg swelling.  Gastrointestinal: Negative for abdominal pain, constipation, diarrhea and heartburn.  Genitourinary: Negative for dysuria, frequency and urgency.  Musculoskeletal: Positive for joint pain (knees). Negative for falls and myalgias.  Skin: Negative.   Neurological: Negative for dizziness, weakness and headaches.  Endo/Heme/Allergies: Positive for  environmental allergies.  Psychiatric/Behavioral: The patient does not have insomnia.     Past Medical History:  Diagnosis Date  . Arthritis   . DDD (degenerative disc disease), lumbosacral    with chronic back pain and radiculopathy with abnormal MRI  . Diabetes mellitus without complication ([place])   . Hypertension    Past Surgical History:  Procedure Laterality Date  . APPENDECTOMY    . HYSTEROTOMY     Social History:   reports that she has never smoked. She has never used smokeless tobacco. She reports that she does not drink alcohol or use drugs.  Family History  Problem Relation Age of Onset  . Diabetes Mother 46  . Diabetes Brother 8  . Ovarian cancer Sister 54  . Heart attack Sister 67  . Diabetes Sister 42    Medications: Patient's Medications  New Prescriptions   No medications on file  Previous Medications   ACETAMINOPHEN (TYLENOL) 500 MG TABLET    Take 2 tablets (1,000 mg total) by mouth every 8 (eight) hours as needed.   LISINOPRIL (PRINIVIL,ZESTRIL) 5 MG TABLET    Take 1 tablet (5 mg total) by mouth daily.   ROSUVASTATIN (CRESTOR) 5 MG TABLET    Take 1 tablet (5 mg total) by mouth daily.  Modified Medications   No medications on file  Discontinued Medications   MELOXICAM (MOBIC) 7.5 MG TABLET    Take 7.5 mg by mouth daily.    Physical Exam:  Vitals:   01/06/19 0914  BP: 136/70  Pulse: 67  Temp: 98.2 F (36.8 C)  TempSrc:  Oral  SpO2: 97%  Weight: 130 lb (59 kg)  Height: 5\' 3"  (1.6 m)   Body mass index is 23.03 kg/m. Wt Readings from Last 3 Encounters:  01/06/19 130 lb (59 kg)  05/22/18 127 lb (57.6 kg)  12/18/17 130 lb (59 kg)    Physical Exam Constitutional:      General: She is not in acute distress.    Appearance: She is not diaphoretic.     Comments: Frail elderly female  HENT:     Head: Normocephalic and atraumatic.  Eyes:     Conjunctiva/sclera: Conjunctivae normal.     Pupils: Pupils are equal, round, and reactive to light.   Neck:     Musculoskeletal: Normal range of motion and neck supple.  Cardiovascular:     Rate and Rhythm: Normal rate and regular rhythm.     Heart sounds: Murmur present.  Pulmonary:     Effort: Pulmonary effort is normal.     Breath sounds: Normal breath sounds.  Abdominal:     General: Bowel sounds are normal.     Palpations: Abdomen is soft.  Genitourinary:    Rectum: Normal. Guaiac result negative. No external hemorrhoid or internal hemorrhoid.  Musculoskeletal:        General: No tenderness.  Skin:    General: Skin is warm and dry.  Neurological:     Mental Status: She is alert and oriented to person, place, and time.     Labs reviewed: Basic Metabolic Panel: Recent Labs    05/22/18 1120 10/09/18 0804  NA 143 142  K 4.5 4.6  CL 106 105  CO2 31 28  GLUCOSE 110* 112*  BUN 11 25  CREATININE 1.14* 1.15*  CALCIUM 9.9 9.7   Liver Function Tests: Recent Labs    05/22/18 1120 10/09/18 0804  AST 20 19  ALT 13 16  BILITOT 0.8 0.8  PROT 7.0 6.9   No results for input(s): LIPASE, AMYLASE in the last 8760 hours. No results for input(s): AMMONIA in the last 8760 hours. CBC: Recent Labs    05/22/18 1120  WBC 3.8  NEUTROABS 2,136  HGB 14.2  HCT 43.6  MCV 85.0  PLT 204   Lipid Panel: Recent Labs    10/09/18 0804  CHOL 193  HDL 72  LDLCALC 100*  TRIG 112  CHOLHDL 2.7   TSH: No results for input(s): TSH in the last 8760 hours. A1C: Lab Results  Component Value Date   HGBA1C 6.1 (H) 10/09/2018     Assessment/Plan 1. Osteoarthritis of right knee, unspecified osteoarthritis type Ongoing pain, reports knee will be swollen and painful. Tylenol helps but does not control pain. - Ambulatory referral to Orthopedics - COMPLETE METABOLIC PANEL WITH GFR; Future  2. Mixed hyperlipidemia Continues on crestor 5 mg daily  - COMPLETE METABOLIC PANEL WITH GFR; Future  3. Spinal stenosis of lumbar region without neurogenic claudication Stable, overall  controlled. encouraged to use heat with muscle rub after when having increase in pain, also use tylenol as needed  4. Essential hypertension Stable on lisinopril 5 mg daily - CBC with Differential/Platelet; Future  5. Type 2 diabetes mellitus with complication, without long-term current use of insulin (HCC) -a1c previously at goal, risk for hypoglycemia therefore glipizide was stopped, it is too early to follow up A1c today therefore she will come back  - Hemoglobin A1c; Future  6. Estrogen deficiency - DG Bone Density; Future  Next appt: 3 month for routine follow up  Jessica K.  Willard, [facility] [phone]

## 2019-01-06 NOTE — Patient Instructions (Addendum)
PLEASE SCHEDULE DIABETIC EYE EXAM with ophthalmologist.   Referral placed to orthopedic due to severe pain in right knee  To come back next week for blood work ( it is too soon today)  Please go into pharmacy to get TDAP- this is a vaccine and we have sent the prescription to your pharmacy.

## 2019-01-12 ENCOUNTER — Other Ambulatory Visit: Payer: Self-pay

## 2019-01-12 ENCOUNTER — Other Ambulatory Visit: Payer: Medicare Other

## 2019-01-12 DIAGNOSIS — E118 Type 2 diabetes mellitus with unspecified complications: Secondary | ICD-10-CM

## 2019-01-12 DIAGNOSIS — E782 Mixed hyperlipidemia: Secondary | ICD-10-CM | POA: Diagnosis not present

## 2019-01-12 DIAGNOSIS — M1711 Unilateral primary osteoarthritis, right knee: Secondary | ICD-10-CM | POA: Diagnosis not present

## 2019-01-12 DIAGNOSIS — I10 Essential (primary) hypertension: Secondary | ICD-10-CM | POA: Diagnosis not present

## 2019-01-13 LAB — COMPLETE METABOLIC PANEL WITH GFR
AG Ratio: 1.6 (calc) (ref 1.0–2.5)
ALT: 15 U/L (ref 6–29)
AST: 22 U/L (ref 10–35)
Albumin: 4.1 g/dL (ref 3.6–5.1)
Alkaline phosphatase (APISO): 47 U/L (ref 37–153)
BUN/Creatinine Ratio: 14 (calc) (ref 6–22)
BUN: 18 mg/dL (ref 7–25)
CO2: 26 mmol/L (ref 20–32)
Calcium: 9.4 mg/dL (ref 8.6–10.4)
Chloride: 108 mmol/L (ref 98–110)
Creat: 1.31 mg/dL — ABNORMAL HIGH (ref 0.60–0.88)
GFR, Est African American: 41 mL/min/{1.73_m2} — ABNORMAL LOW (ref >=60)
GFR, Est Non African American: 36 mL/min/{1.73_m2} — ABNORMAL LOW (ref >=60)
Globulin: 2.6 g/dL (calc) (ref 1.9–3.7)
Glucose, Bld: 96 mg/dL (ref 65–99)
Potassium: 4.5 mmol/L (ref 3.5–5.3)
Sodium: 144 mmol/L (ref 135–146)
Total Bilirubin: 0.7 mg/dL (ref 0.2–1.2)
Total Protein: 6.7 g/dL (ref 6.1–8.1)

## 2019-01-13 LAB — CBC WITH DIFFERENTIAL/PLATELET
Absolute Monocytes: 312 cells/uL (ref 200–950)
Basophils Absolute: 31 cells/uL (ref 0–200)
Basophils Relative: 0.7 %
Eosinophils Absolute: 79 cells/uL (ref 15–500)
Eosinophils Relative: 1.8 %
HCT: 39.1 % (ref 35.0–45.0)
Hemoglobin: 13 g/dL (ref 11.7–15.5)
Lymphs Abs: 1817 cells/uL (ref 850–3900)
MCH: 28.8 pg (ref 27.0–33.0)
MCHC: 33.2 g/dL (ref 32.0–36.0)
MCV: 86.7 fL (ref 80.0–100.0)
MPV: 11.4 fL (ref 7.5–12.5)
Monocytes Relative: 7.1 %
Neutro Abs: 2160 cells/uL (ref 1500–7800)
Neutrophils Relative %: 49.1 %
Platelets: 168 10*3/uL (ref 140–400)
RBC: 4.51 10*6/uL (ref 3.80–5.10)
RDW: 13.8 % (ref 11.0–15.0)
Total Lymphocyte: 41.3 %
WBC: 4.4 10*3/uL (ref 3.8–10.8)

## 2019-01-13 LAB — HEMOGLOBIN A1C
Hgb A1c MFr Bld: 6.2 % of total Hgb — ABNORMAL HIGH (ref <5.7)
Mean Plasma Glucose: 131 (calc)
eAG (mmol/L): 7.3 (calc)

## 2019-01-16 ENCOUNTER — Ambulatory Visit (INDEPENDENT_AMBULATORY_CARE_PROVIDER_SITE_OTHER): Payer: Medicare Other | Admitting: Orthopaedic Surgery

## 2019-01-16 ENCOUNTER — Ambulatory Visit: Payer: Self-pay

## 2019-01-16 ENCOUNTER — Ambulatory Visit (INDEPENDENT_AMBULATORY_CARE_PROVIDER_SITE_OTHER): Payer: Medicare Other

## 2019-01-16 ENCOUNTER — Encounter: Payer: Self-pay | Admitting: Orthopaedic Surgery

## 2019-01-16 ENCOUNTER — Other Ambulatory Visit: Payer: Self-pay

## 2019-01-16 DIAGNOSIS — M1711 Unilateral primary osteoarthritis, right knee: Secondary | ICD-10-CM | POA: Insufficient documentation

## 2019-01-16 MED ORDER — BUPIVACAINE HCL 0.5 % IJ SOLN
2.0000 mL | INTRAMUSCULAR | Status: AC | PRN
  Administered 2019-01-16: 09:00:00 2 mL via INTRA_ARTICULAR

## 2019-01-16 MED ORDER — LIDOCAINE HCL 1 % IJ SOLN
2.0000 mL | INTRAMUSCULAR | Status: AC | PRN
  Administered 2019-01-16: 2 mL

## 2019-01-16 MED ORDER — METHYLPREDNISOLONE ACETATE 40 MG/ML IJ SUSP
40.0000 mg | INTRAMUSCULAR | Status: AC | PRN
  Administered 2019-01-16: 09:00:00 40 mg via INTRA_ARTICULAR

## 2019-01-16 MED ORDER — DICLOFENAC SODIUM 1 % TD GEL: 2.0000 g | Freq: Four times a day (QID) | TRANSDERMAL | 6 refills | Status: AC

## 2019-01-16 NOTE — Progress Notes (Signed)
Office Visit Note   Patient: Monica Cohen           Date of Birth: 1925/08/01           MRN: [id number] Visit Date: 01/16/2019              Requested by: Lauree Chandler, NP [place],   [id number] PCP: Lauree Chandler, NP   Assessment & Plan: Visit Diagnoses:  1. Primary osteoarthritis of right knee     Plan: Impression is moderate osteoarthritis right knee.  Tylenol has not given significant relief.  We performed cortisone injection today.  I have also sent in prescription for Voltaren gel to try.  Questions encouraged and answered.  Follow-up as needed.  Follow-Up Instructions: Return if symptoms worsen or fail to improve.   Orders:  Orders Placed This Encounter  Procedures  . XR KNEE 3 VIEW RIGHT   Meds ordered this encounter  Medications  . diclofenac sodium (VOLTAREN) 1 % GEL    Sig: Apply 2 g topically 4 (four) times daily.    Dispense:  150 g    Refill:  6      Procedures: Large Joint Inj: R knee on 01/16/2019 8:49 AM Indications: pain Details: 22 G needle  Arthrogram: No  Medications: 40 mg methylPREDNISolone acetate 40 MG/ML; 2 mL lidocaine 1 %; 2 mL bupivacaine 0.5 % Consent was given by the patient. Patient was prepped and draped in the usual sterile fashion.       Clinical Data: No additional findings.   Subjective: Chief Complaint  Patient presents with  . Right Knee - Pain    Monica Cohen is a very pleasant 85 year old female comes in with aching throbbing right knee pain for several months.  Denies any injuries.  Denies any mechanical symptoms.  The pain is constant and worse with increased activity and weightbearing.  She takes Tylenol extra strength with minimal relief.  Denies any numbness and tingling.   Review of Systems  Constitutional: Negative.   HENT: Negative.   Eyes: Negative.   Respiratory: Negative.   Cardiovascular: Negative.   Endocrine: Negative.   Musculoskeletal: Negative.   Neurological:  Negative.   Hematological: Negative.   Psychiatric/Behavioral: Negative.   All other systems reviewed and are negative.    Objective: Vital Signs: There were no vitals taken for this visit.  Physical Exam Vitals signs and nursing note reviewed.  Constitutional:      Appearance: She is well-developed.  HENT:     Head: Normocephalic and atraumatic.  Neck:     Musculoskeletal: Neck supple.  Pulmonary:     Effort: Pulmonary effort is normal.  Abdominal:     Palpations: Abdomen is soft.  Skin:    General: Skin is warm.     Capillary Refill: Capillary refill takes less than 2 seconds.  Neurological:     Mental Status: She is alert and oriented to person, place, and time.  Psychiatric:        Behavior: Behavior normal.        Thought Content: Thought content normal.        Judgment: Judgment normal.     Ortho Exam Right knee exam shows a trace joint effusion.  Collaterals and cruciates are stable.  Preserved joint range of motion.  1+ patellofemoral crepitus. Specialty Comments:  No specialty comments available.  Imaging: Xr Knee 3 View Right  Result Date: 01/16/2019 Moderate tricompartmental DJD.    PMFS History: Patient Active Problem  List   Diagnosis Date Noted  . Primary osteoarthritis of right knee 01/16/2019  . Mixed hyperlipidemia 09/25/2017  . Chronic right shoulder pain 09/25/2017  . Spinal stenosis of lumbar region 06/25/2017  . Hyponatremia 11/23/2016  . Acute metabolic encephalopathy 11/23/2016  . Essential hypertension 11/23/2016  . Diabetes mellitus (HCC) 11/23/2016  . Hypokalemia 11/23/2016   Past Medical History:  Diagnosis Date  . Arthritis   . DDD (degenerative disc disease), lumbosacral    with chronic back pain and radiculopathy with abnormal MRI  . Diabetes mellitus without complication (HCC)   . Hypertension     Family History  Problem Relation Age of Onset  . Diabetes Mother 3589  . Diabetes Brother 2075  . Ovarian cancer Sister 7045   . Heart attack Sister 2463  . Diabetes Sister 6070    Past Surgical History:  Procedure Laterality Date  . APPENDECTOMY    . HYSTEROTOMY     Social History   Occupational History  . Not on file  Tobacco Use  . Smoking status: Never Smoker  . Smokeless tobacco: Never Used  Substance and Sexual Activity  . Alcohol use: No  . Drug use: No  . Sexual activity: Not Currently    Birth control/protection: None

## 2019-02-03 ENCOUNTER — Other Ambulatory Visit: Payer: Self-pay | Admitting: Nurse Practitioner

## 2019-02-03 DIAGNOSIS — M1711 Unilateral primary osteoarthritis, right knee: Secondary | ICD-10-CM

## 2019-02-18 IMAGING — DX DG HIP (WITH OR WITHOUT PELVIS) 2-3V*R*
3 series · 3 of 3 positions shown · non-contrast
Comparison: None.

CLINICAL DATA: Right hip pain beginning yesterday. No known injury.

EXAM:
DG HIP (WITH OR WITHOUT PELVIS) 2-3V RIGHT

[t pelvis ap]
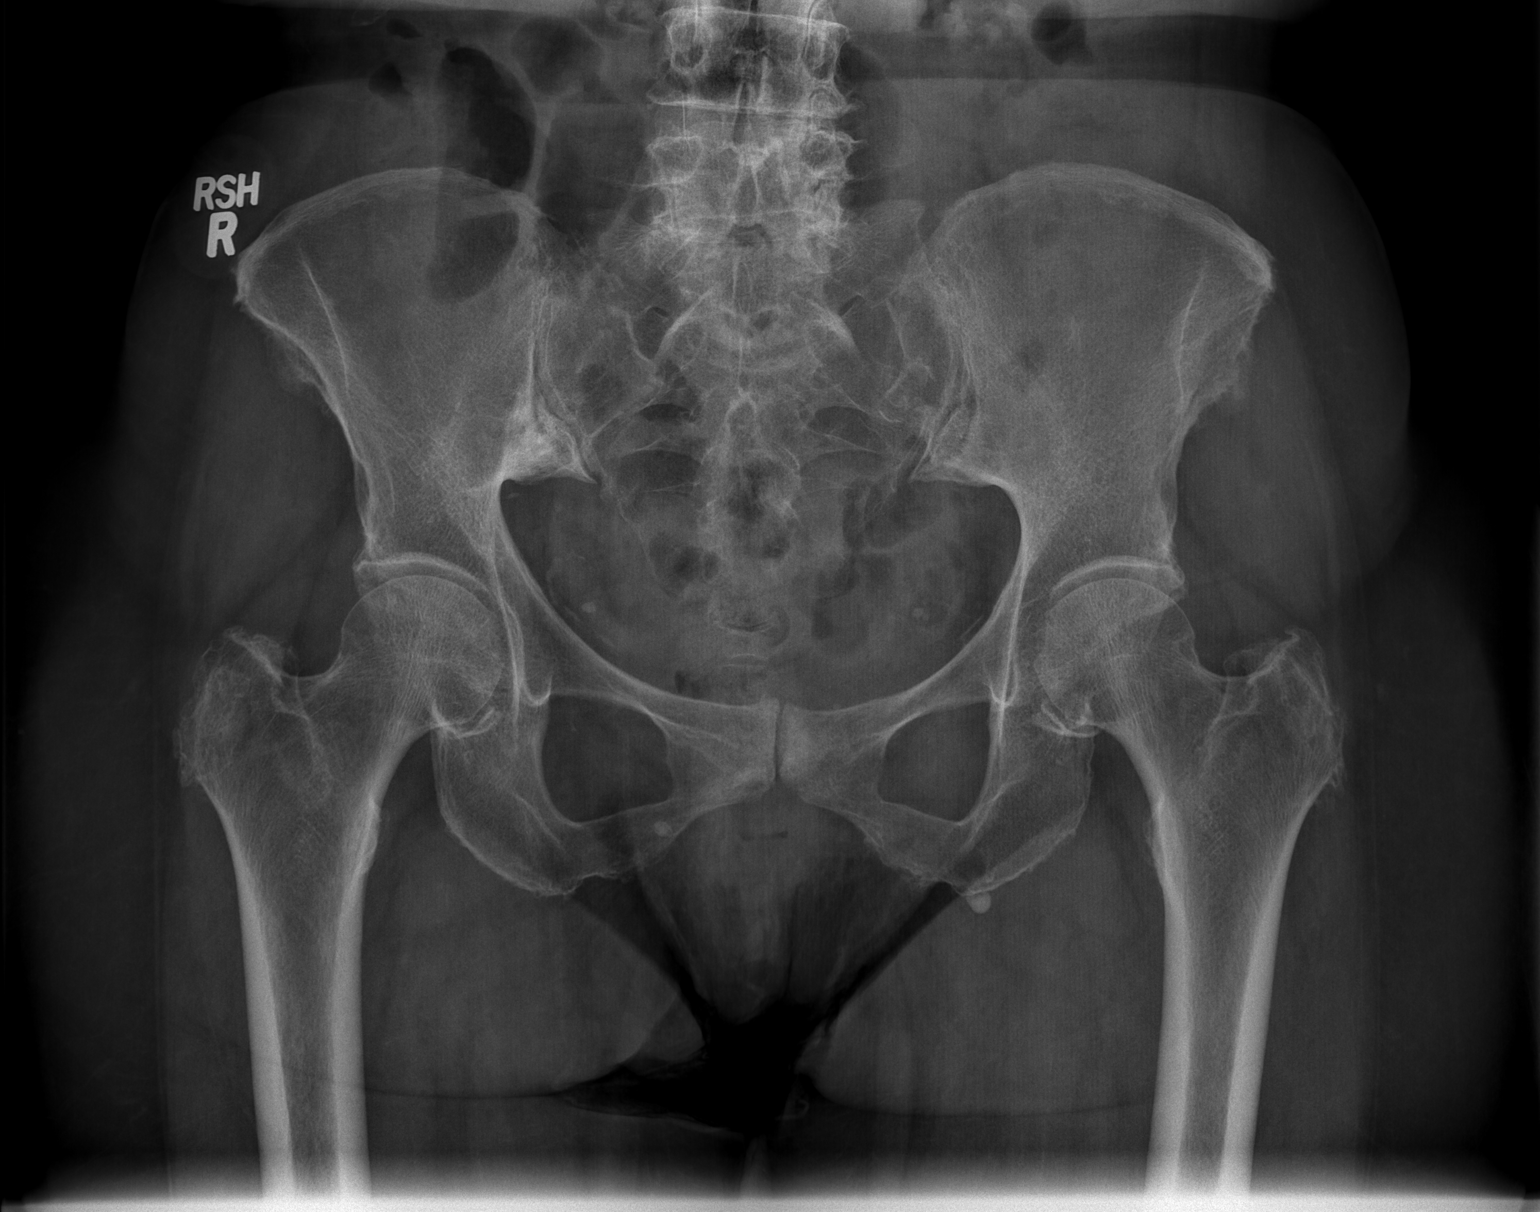

[t hip ap right]
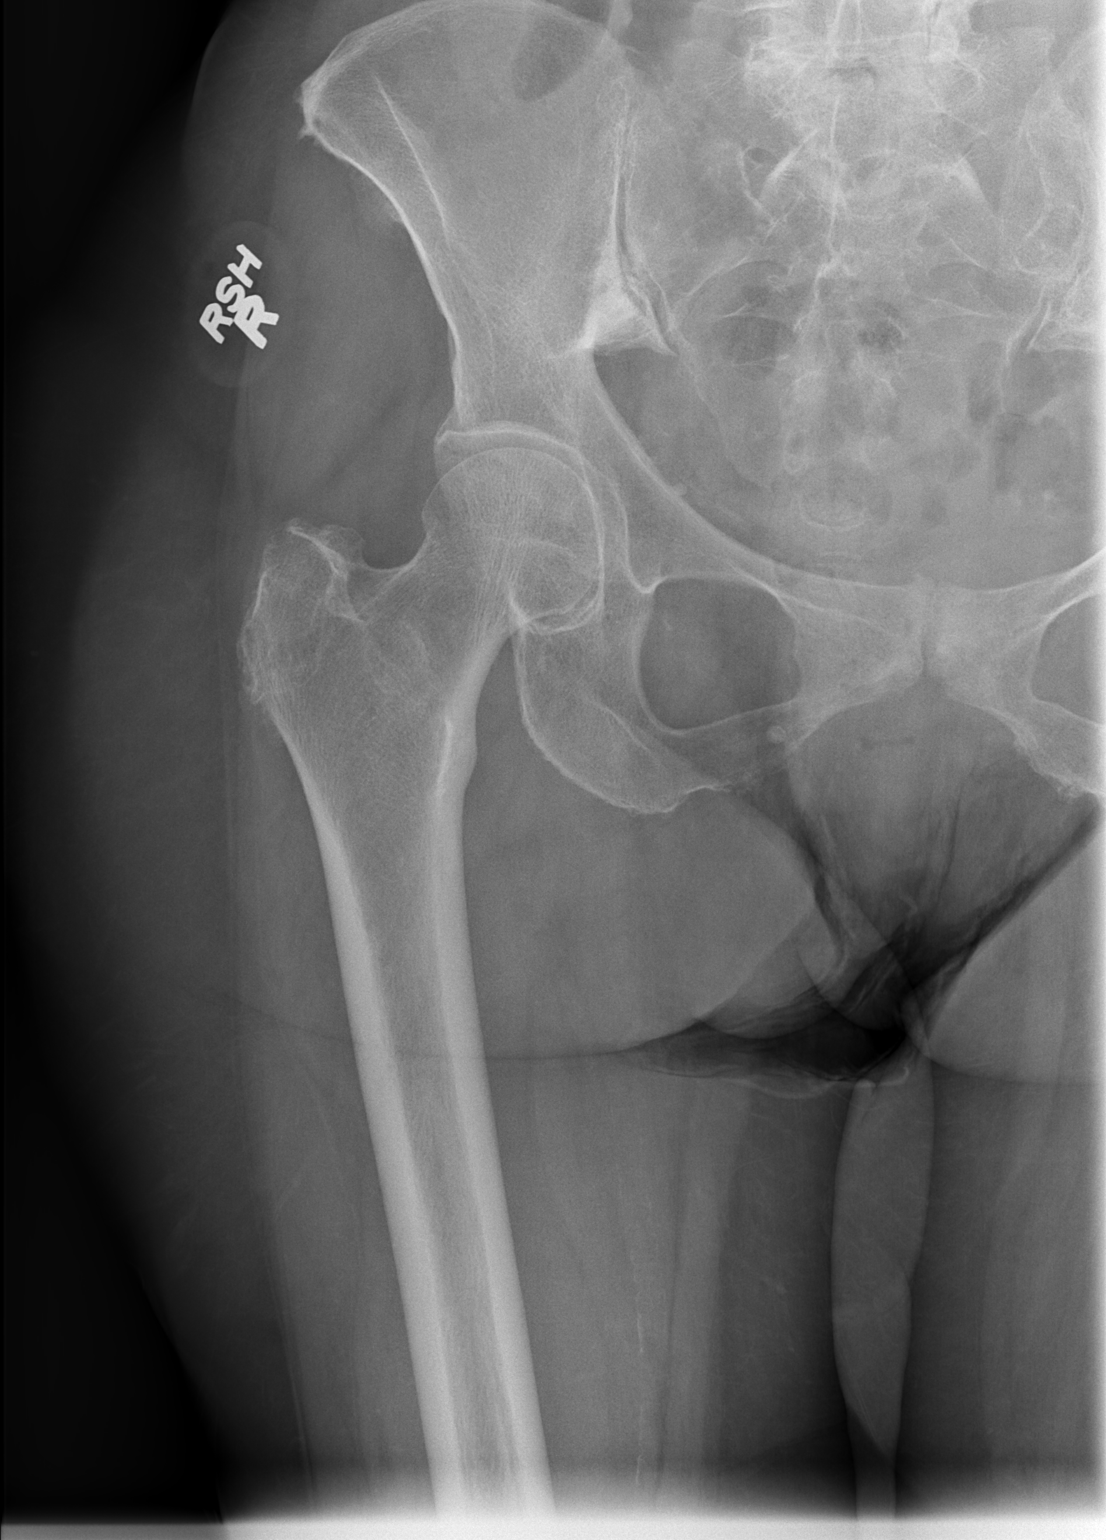

[t hip frog leg right]
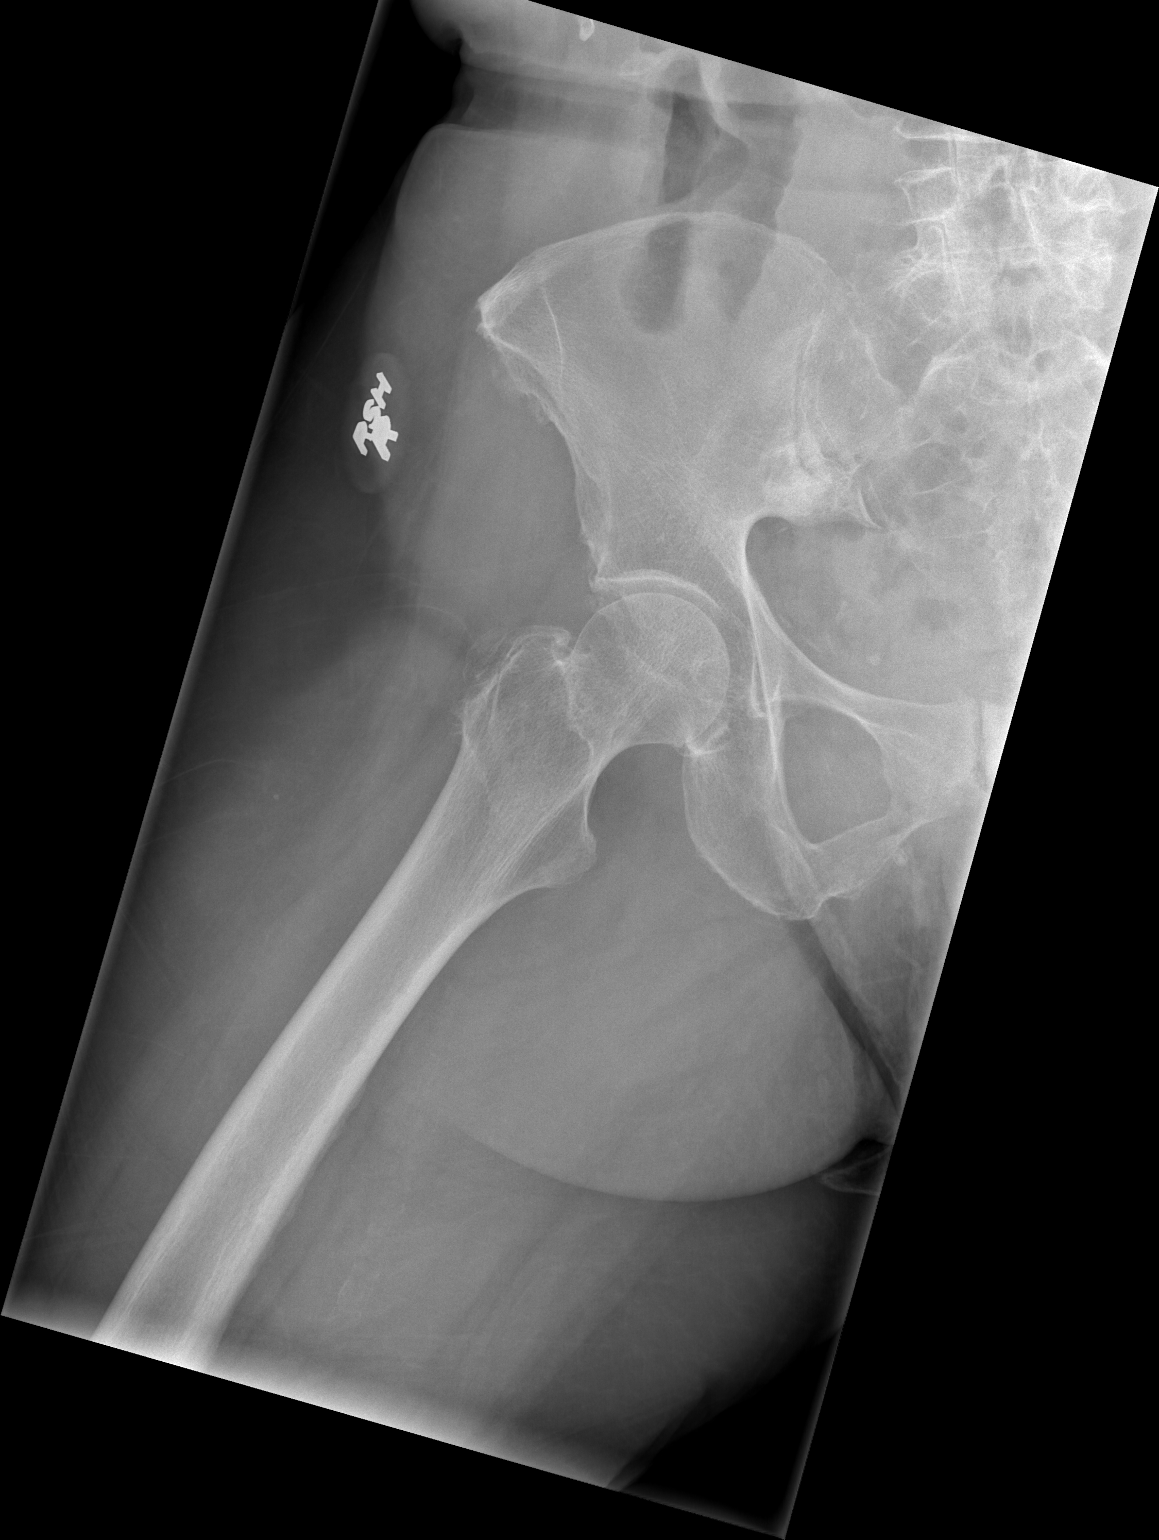

[3 of 3 positions shown; findings below may reference images not displayed]

FINDINGS: There is no evidence of hip fracture or dislocation. There is no
evidence of arthropathy or other focal bone abnormality involving
the hip. Degenerative spurring and sclerosis is seen involving the
pubic symphysis and right sacroiliac joint. Lower lumbar spine
degenerative changes also seen.
IMPRESSION: No acute findings.

Degenerative changes involving pubic symphysis, right sacroiliac
joint, and lower lumbar spine.

## 2019-02-18 IMAGING — CR DG LUMBAR SPINE COMPLETE 4+V
5 series · 5 of 5 positions shown · non-contrast
Comparison: None.

CLINICAL DATA: Diffuse lower back pain, no acute injury

EXAM:
LUMBAR SPINE - COMPLETE 4+ VIEW

[l-spine ap]
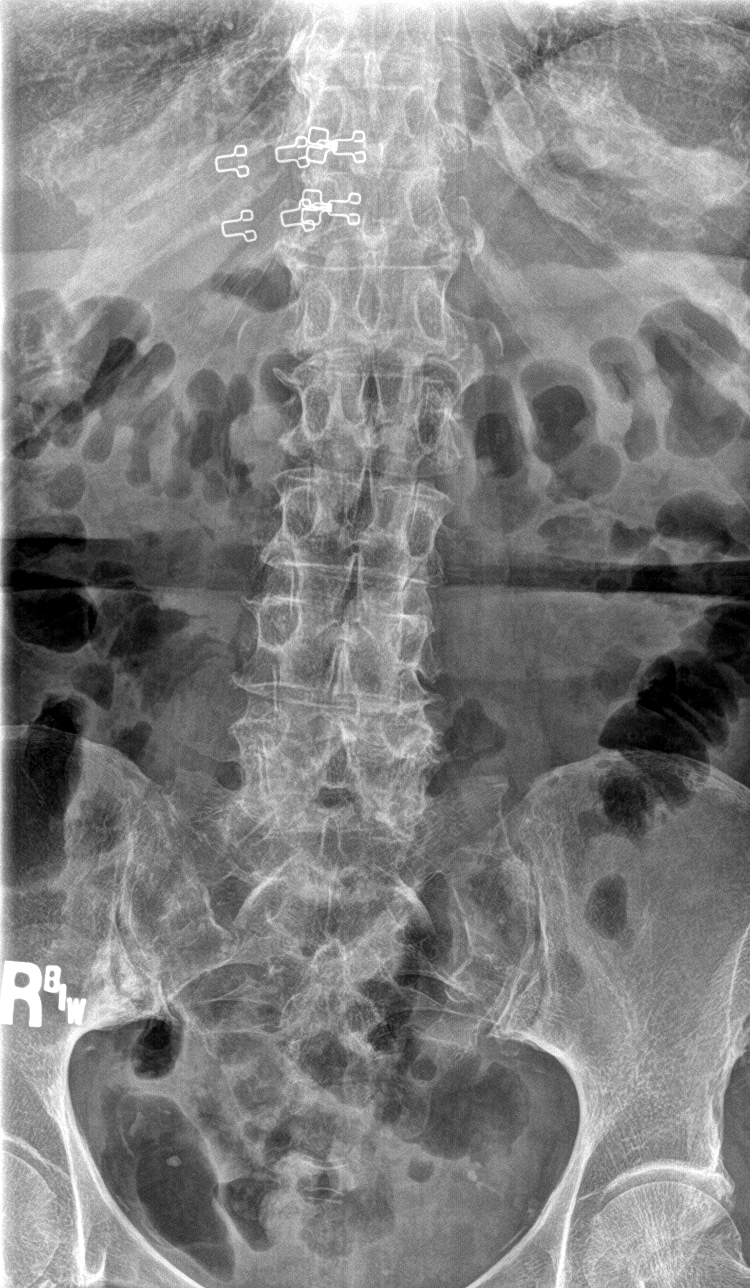

[l-spine obl (1 of 2)]
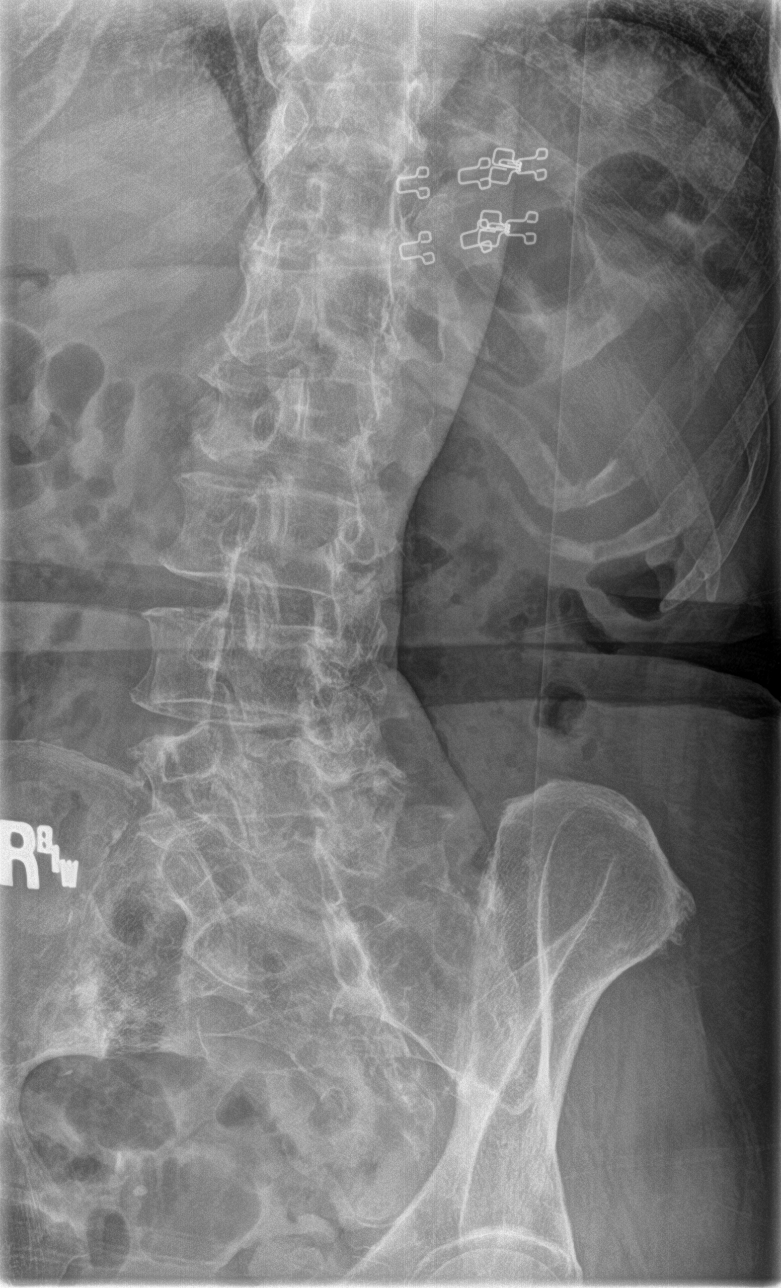

[l-spine obl (2 of 2)]
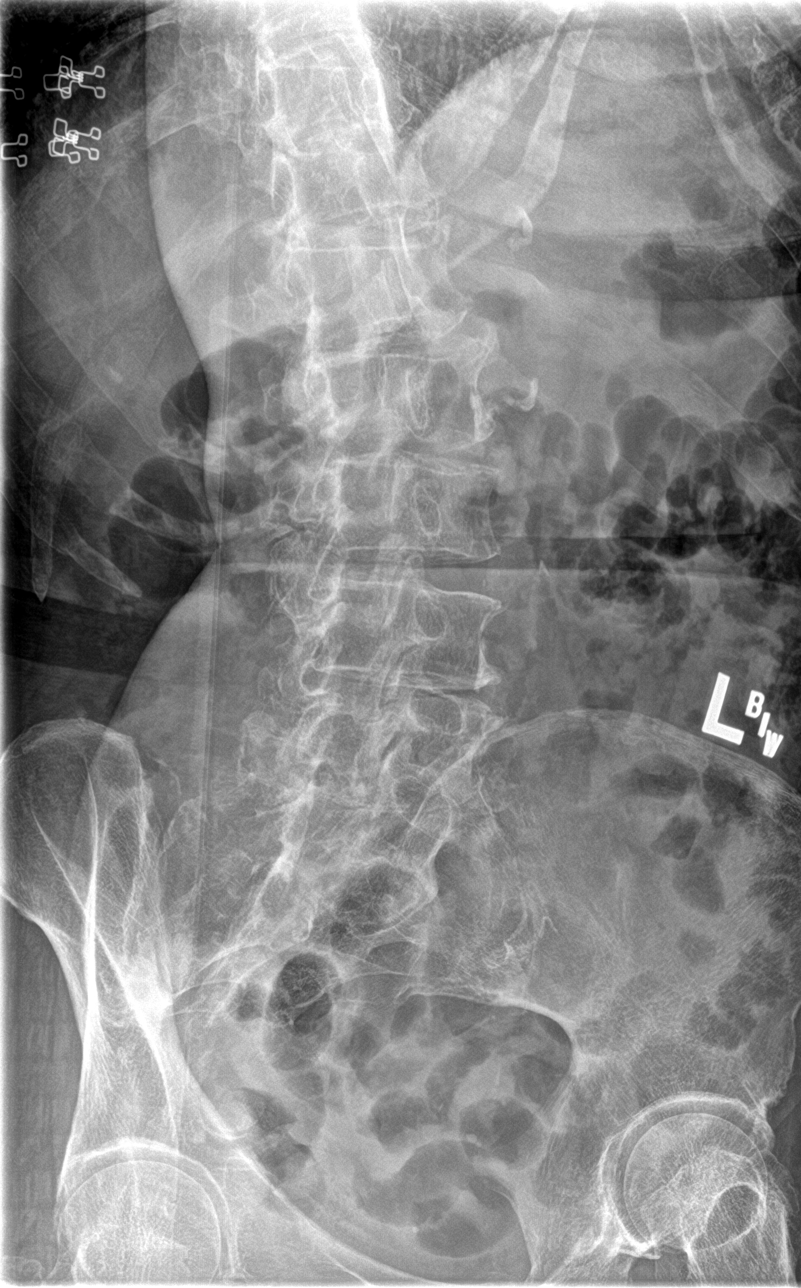

[l-spine lat]
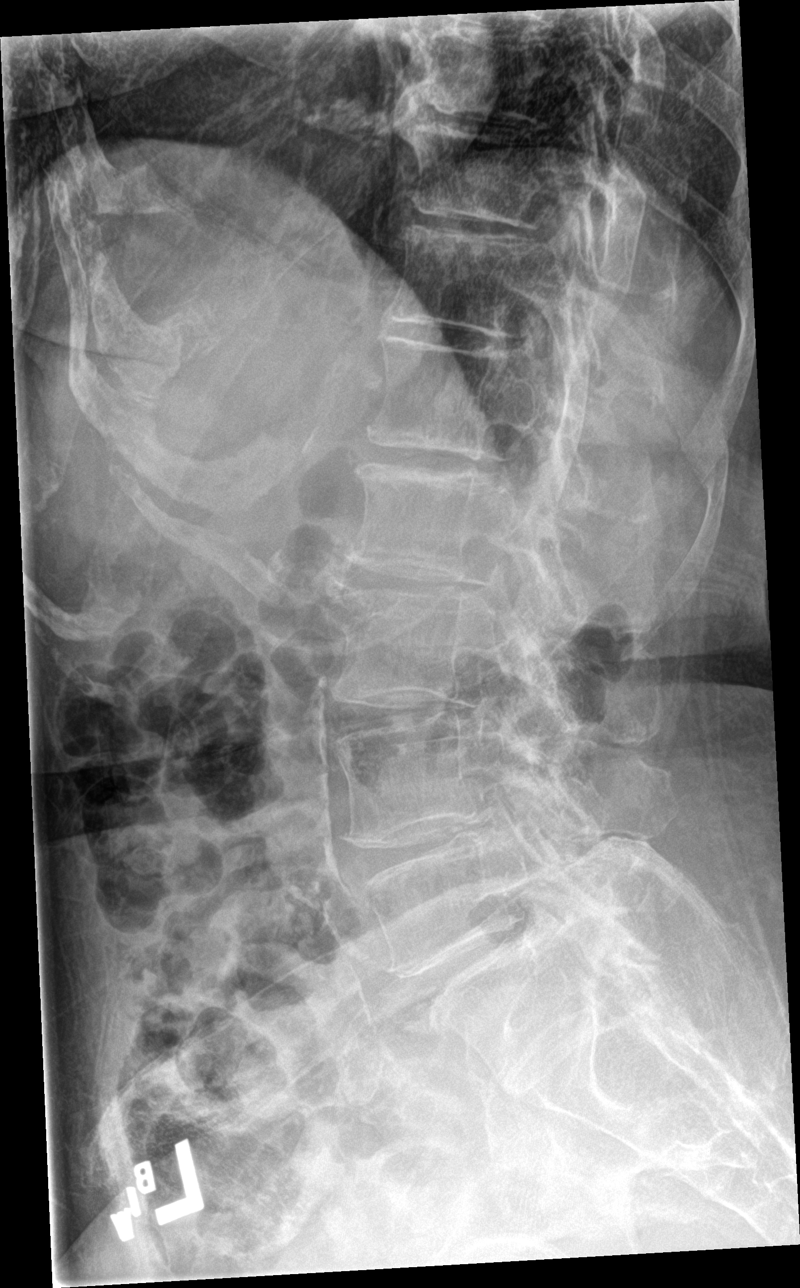

[l-spine spot]
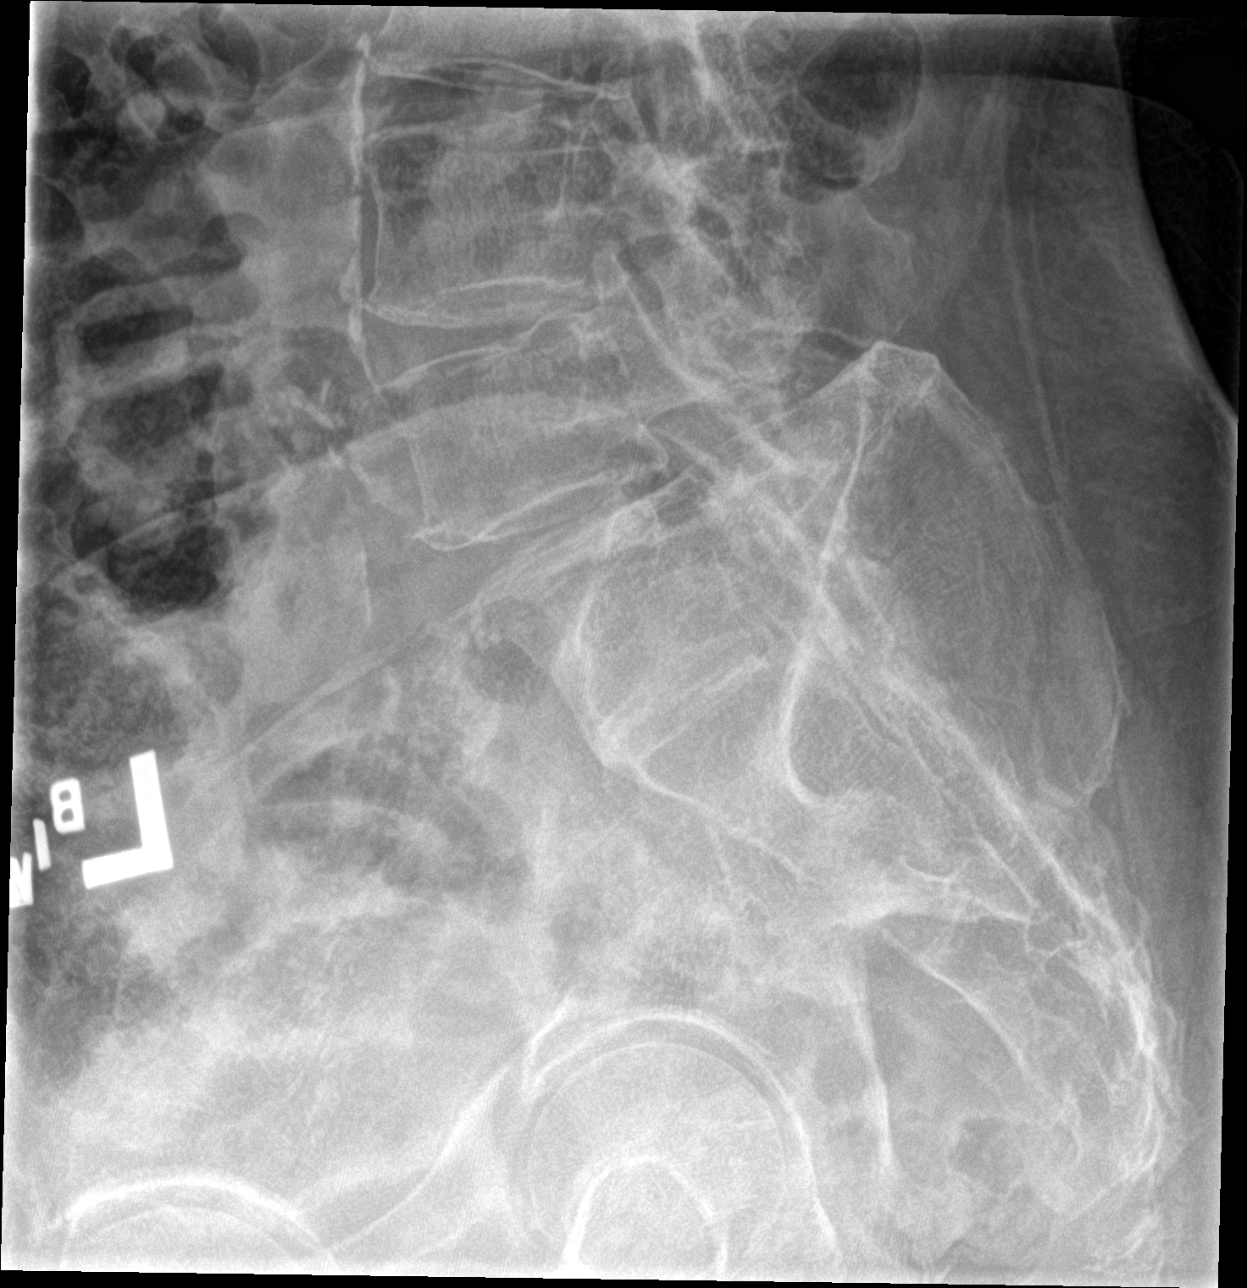

[5 of 5 positions shown; findings below may reference images not displayed]

FINDINGS: The lumbar vertebrae are normal alignment. The bones are diffusely
osteopenic. Minimal degenerative disc disease is present primarily
at L1-2, L2-3, and L5-S1 levels. No compression deformity is seen.
The SI joints are corticated.
IMPRESSION: Normal alignment. Mild degenerative disc disease for age. Diffuse
osteopenia.

## 2019-02-24 ENCOUNTER — Encounter: Payer: Self-pay | Admitting: Family

## 2019-02-24 ENCOUNTER — Other Ambulatory Visit: Payer: Self-pay

## 2019-02-24 ENCOUNTER — Ambulatory Visit (INDEPENDENT_AMBULATORY_CARE_PROVIDER_SITE_OTHER): Payer: Medicare Other | Admitting: Family

## 2019-02-24 VITALS — BP 138/66 | HR 72 | Temp 98.0°F | Ht 63.0 in | Wt 124.8 lb

## 2019-02-24 DIAGNOSIS — M25551 Pain in right hip: Secondary | ICD-10-CM

## 2019-02-24 DIAGNOSIS — Z23 Encounter for immunization: Secondary | ICD-10-CM | POA: Diagnosis not present

## 2019-02-24 DIAGNOSIS — M1711 Unilateral primary osteoarthritis, right knee: Secondary | ICD-10-CM

## 2019-02-24 DIAGNOSIS — M5431 Sciatica, right side: Secondary | ICD-10-CM

## 2019-02-24 MED ORDER — PREDNISONE 20 MG PO TABS: ORAL_TABLET | ORAL | 0 refills | Status: DC

## 2019-02-24 MED ORDER — KETOROLAC TROMETHAMINE 30 MG/ML IJ SOLN
30.0000 mg | Freq: Once | INTRAMUSCULAR | Status: AC
  Administered 2019-02-24: 14:00:00 30 mg via INTRAMUSCULAR

## 2019-02-24 MED ORDER — KETOROLAC TROMETHAMINE 30 MG/ML IJ SOLN: 30.0000 mg | Freq: Once | INTRAMUSCULAR | Status: AC

## 2019-02-24 NOTE — Progress Notes (Signed)
Provider: Dinah Ngetich FNP-C  Lauree Chandler, NP  Patient Care Team: Lauree Chandler, NP as PCP - General (Geriatric Medicine) Earnie Larsson, MD as Consulting Physician (Neurosurgery)  Extended Emergency Contact Information Primary Emergency Contact: Sonda Rumble Address: [address] [place] [address] Johnnette Litter of [place] Phone: [phone] Relation: Daughter Secondary Emergency Contact: Su Ley [place] of [place] Phone: [phone] Relation: Son  Code Status:  Goals of care: Advanced Directive information Advanced Directives 01/06/2019  Does Patient Have a Medical Advance Directive? Yes  Type of Advance Directive [place]  Does patient want to make changes to medical advance directive? No - Patient declined  Copy of [place] in Chart? Yes - validated most recent copy scanned in chart (See row information)  Would patient like information on creating a medical advance directive? -     Chief Complaint  Patient presents with  . Acute Visit    Followup of right hip pain. Daughter, Judeth Porch, accompanies her.     HPI:  Pt is a 85 y.o. female seen today for an acute visit for evaluation of right hip pain x 2-3 weeks.she is here with her daughter Judeth Porch.Patient states worsening right hip pain running down her right thigh.she is unable to sit on right hip due to the pain.Pain gets a slightly better when she walks.Also has some numbness and tingling on right foot.she has taken tylenol without any relief.No recent falls or injury to hip/leg.she denies any limb weakness,bowel or bladder control. Right knee pain -states saw Orthopedic specialist 01/16/2019 had a cortisol injection with much improvement of knee pain.      Past Medical History:  Diagnosis Date  . Arthritis   . DDD (degenerative disc disease), lumbosacral    with chronic back pain and radiculopathy with abnormal MRI   . Diabetes mellitus without complication ([facility])   . Hypertension    Past Surgical History:  Procedure Laterality Date  . APPENDECTOMY    . HYSTEROTOMY      No Known Allergies  Outpatient Encounter Medications as of 02/24/2019  Medication Sig  . acetaminophen (TYLENOL) 500 MG tablet Take 2 tablets (1,000 mg total) by mouth every 8 (eight) hours as needed.  . diclofenac sodium (VOLTAREN) 1 % GEL Apply 2 g topically 4 (four) times daily.  Marland Kitchen lisinopril (PRINIVIL,ZESTRIL) 5 MG tablet Take 1 tablet (5 mg total) by mouth daily.  . rosuvastatin (CRESTOR) 5 MG tablet Take 1 tablet (5 mg total) by mouth daily.  . predniSONE (DELTASONE) 20 MG tablet Prednisone 20 mg tablet Take 2 tablets by mouth  daily x 3 day then  Take 20 mg tablet one by mouth daily x 3 days then  Take 10 mg tablet ( 1/2 Tablet) by mouth daily x 3 days then  Take 5 mg tablet  By mouth daily x 3 days and stop.   Facility-Administered Encounter Medications as of 02/24/2019  Medication  . ketorolac (TORADOL) 30 MG/ML injection 30 mg  . [COMPLETED] ketorolac (TORADOL) 30 MG/ML injection 30 mg    Review of Systems  Constitutional: Negative for chills, fatigue and fever.  HENT: Positive for hearing loss.   Respiratory: Negative for cough, chest tightness, shortness of breath and wheezing.   Cardiovascular: Negative for chest pain, palpitations and leg swelling.  Genitourinary: Negative for difficulty urinating, dysuria, flank pain, frequency and urgency.  Musculoskeletal: Positive for arthralgias. Negative for gait problem.  Has a walker but does not use it.  Skin: Negative for color change, pallor and rash.    Immunization History  Administered Date(s) Administered  . Influenza, High Dose Seasonal PF 03/05/2017, 04/09/2018  . Influenza,inj,Quad PF,6+ Mos 03/27/2016  . Pneumococcal Conjugate-13 12/18/2017   Pertinent  Health Maintenance Due  Topic Date Due  . OPHTHALMOLOGY EXAM  04/09/1936  . DEXA SCAN  04/09/1993   . PNA vac Low Risk Adult (2 of 2 - PPSV23) 12/19/2018  . INFLUENZA VACCINE  01/17/2019  . HEMOGLOBIN A1C  07/15/2019  . FOOT EXAM  01/06/2020   Fall Risk  01/06/2019 11/18/2018 10/02/2018 05/22/2018 12/18/2017  Falls in the past year? 0 0 0 0 No  Number falls in past yr: 0 0 0 0 -  Comment - - - - -  Injury with Fall? 0 0 0 0 -    Vitals:   02/24/19 1303  BP: 138/66  Pulse: 72  Temp: 98 F (36.7 C)  TempSrc: Oral  SpO2: 98%  Weight: 124 lb 12.8 oz (56.6 kg)  Height: 5\' 3"  (1.6 m)   Body mass index is 22.11 kg/m. Physical Exam Vitals signs reviewed.  Constitutional:      General: She is not in acute distress.    Appearance: She is normal weight. She is not ill-appearing or toxic-appearing.  Eyes:     General: No scleral icterus.       Right eye: No discharge.        Left eye: No discharge.     Conjunctiva/sclera: Conjunctivae normal.     Pupils: Pupils are equal, round, and reactive to light.  Cardiovascular:     Rate and Rhythm: Normal rate.     Pulses: Normal pulses.     Heart sounds: Murmur present. No friction rub. No gallop.   Pulmonary:     Effort: Pulmonary effort is normal. No respiratory distress.     Breath sounds: Normal breath sounds. No wheezing, rhonchi or rales.  Chest:     Chest wall: No tenderness.  Abdominal:     General: Bowel sounds are normal. There is no distension.     Palpations: Abdomen is soft. There is no mass.     Tenderness: There is no abdominal tenderness. There is no right CVA tenderness, left CVA tenderness, guarding or rebound.  Musculoskeletal: Normal range of motion.        General: No swelling or tenderness.     Right hip: She exhibits normal range of motion, normal strength, no swelling, no crepitus and no deformity.     Right knee: She exhibits normal range of motion, no swelling, no effusion, no erythema, normal alignment, no LCL laxity and normal patellar mobility.     Right lower leg: No edema.     Left lower leg: No edema.      Comments: Negative leg raise.  Skin:    General: Skin is warm and dry.     Coloration: Skin is not pale.     Findings: No bruising, erythema or rash.  Neurological:     Mental Status: She is alert.     Cranial Nerves: No cranial nerve deficit.     Sensory: No sensory deficit.     Motor: No weakness.     Coordination: Coordination normal.     Gait: Gait normal.     Comments: HOH on the left ear   Psychiatric:        Mood and Affect: Mood normal.  Behavior: Behavior normal.        Thought Content: Thought content normal.        Judgment: Judgment normal.    Labs reviewed: Recent Labs    05/22/18 1120 10/09/18 0804 01/12/19 0852  NA 143 142 144  K 4.5 4.6 4.5  CL 106 105 108  CO2 31 28 26   GLUCOSE 110* 112* 96  BUN 11 25 18   CREATININE 1.14* 1.15* 1.31*  CALCIUM 9.9 9.7 9.4   Recent Labs    05/22/18 1120 10/09/18 0804 01/12/19 0852  AST 20 19 22   ALT 13 16 15   BILITOT 0.8 0.8 0.7  PROT 7.0 6.9 6.7   Recent Labs    05/22/18 1120 01/12/19 0852  WBC 3.8 4.4  NEUTROABS 2,136 2,160  HGB 14.2 13.0  HCT 43.6 39.1  MCV 85.0 86.7  PLT 204 168   Lab Results  Component Value Date   TSH 1.033 11/23/2016   Lab Results  Component Value Date   HGBA1C 6.2 (H) 01/12/2019   Lab Results  Component Value Date   CHOL 193 10/09/2018   HDL 72 10/09/2018   LDLCALC 100 (H) 10/09/2018   TRIG 112 10/09/2018   CHOLHDL 2.7 10/09/2018    Significant Diagnostic Results in last 30 days:  No results found.  Assessment/Plan 1. Right sciatic nerve pain Pain radiates down to right thigh unable to sit on right gluteal due to pain.No tenderness to palpation.Negative leg raise. - ketorolac (TORADOL) 30 MG/ML injection 30 mg administered by Asher Muir CMA with much improvement of pain. Tapered prednisone send to pharmacy.Prednisone 20 mg tablet take 2 tabs ( 40 mg)by mouth x 3 days then  1 tab (20 mg) by mouth daily  x 3 days then  10 mg  tablet by mouth daily x 3  days then 1/2 Tablet (5 mg ) x 3 days then stop.   2. Right hip pain Continue on Acetaminophen as needed for pain.Tapered prednisone as above.  3. Osteoarthritis of right knee, unspecified osteoarthritis  Continue on Acetaminophen as needed for pain.continue to follow up with Orthopedic as directed. . Family/ staff Communication: Reviewed plan of care with patient and Daughter Maurine Minister.   Labs/tests ordered: None   Dinah C Ngetich, NP

## 2019-02-24 NOTE — Patient Instructions (Signed)
Sciatica ° °Sciatica is pain, weakness, tingling, or loss of feeling (numbness) along the sciatic nerve. The sciatic nerve starts in the lower back and goes down the back of each leg. Sciatica usually goes away on its own or with treatment. Sometimes, sciatica may come back (recur). °What are the causes? °This condition happens when the sciatic nerve is pinched or has pressure put on it. This may be the result of: °· A disk in between the bones of the spine bulging out too far (herniated disk). °· Changes in the spinal disks that occur with aging. °· A condition that affects a muscle in the butt. °· Extra bone growth near the sciatic nerve. °· A break (fracture) of the area between your hip bones (pelvis). °· Pregnancy. °· Tumor. This is rare. °What increases the risk? °You are more likely to develop this condition if you: °· Play sports that put pressure or stress on the spine. °· Have poor strength and ease of movement (flexibility). °· Have had a back injury in the past. °· Have had back surgery. °· Sit for long periods of time. °· Do activities that involve bending or lifting over and over again. °· Are very overweight (obese). °What are the signs or symptoms? °Symptoms can vary from mild to very bad. They may include: °· Any of these problems in the lower back, leg, hip, or butt: °? Mild tingling, loss of feeling, or dull aches. °? Burning sensations. °? Sharp pains. °· Loss of feeling in the back of the calf or the sole of the foot. °· Leg weakness. °· Very bad back pain that makes it hard to move. °These symptoms may get worse when you cough, sneeze, or laugh. They may also get worse when you sit or stand for long periods of time. °How is this treated? °This condition often gets better without any treatment. However, treatment may include: °· Changing or cutting back on physical activity when you have pain. °· Doing exercises and stretching. °· Putting ice or heat on the affected area. °· Medicines that  help: °? To relieve pain and swelling. °? To relax your muscles. °· Shots (injections) of medicines that help to relieve pain, irritation, and swelling. °· Surgery. °Follow these instructions at home: °Medicines °· Take over-the-counter and prescription medicines only as told by your doctor. °· Ask your doctor if the medicine prescribed to you: °? Requires you to avoid driving or using heavy machinery. °? Can cause trouble pooping (constipation). You may need to take these steps to prevent or treat trouble pooping: °§ Drink enough fluids to keep your pee (urine) pale yellow. °§ Take over-the-counter or prescription medicines. °§ Eat foods that are high in fiber. These include beans, whole grains, and fresh fruits and vegetables. °§ Limit foods that are high in fat and sugar. These include fried or sweet foods. °Managing pain ° °  ° °· If told, put ice on the affected area. °? Put ice in a plastic bag. °? Place a towel between your skin and the bag. °? Leave the ice on for 20 minutes, 2-3 times a day. °· If told, put heat on the affected area. Use the heat source that your doctor tells you to use, such as a moist heat pack or a heating pad. °? Place a towel between your skin and the heat source. °? Leave the heat on for 20-30 minutes. °? Remove the heat if your skin turns bright red. This is very important if you are   unable to feel pain, heat, or cold. You may have a greater risk of getting burned. °Activity ° °· Return to your normal activities as told by your doctor. Ask your doctor what activities are safe for you. °· Avoid activities that make your symptoms worse. °· Take short rests during the day. °? When you rest for a long time, do some physical activity or stretching between periods of rest. °? Avoid sitting for a long time without moving. Get up and move around at least one time each hour. °· Exercise and stretch regularly, as told by your doctor. °· Do not lift anything that is heavier than 10 lb (4.5 kg)  while you have symptoms of sciatica. °? Avoid lifting heavy things even when you do not have symptoms. °? Avoid lifting heavy things over and over. °· When you lift objects, always lift in a way that is safe for your body. To do this, you should: °? Bend your knees. °? Keep the object close to your body. °? Avoid twisting. °General instructions °· Stay at a healthy weight. °· Wear comfortable shoes that support your feet. Avoid wearing high heels. °· Avoid sleeping on a mattress that is too soft or too hard. You might have less pain if you sleep on a mattress that is firm enough to support your back. °· Keep all follow-up visits as told by your doctor. This is important. °Contact a doctor if: °· You have pain that: °? Wakes you up when you are sleeping. °? Gets worse when you lie down. °? Is worse than the pain you have had in the past. °? Lasts longer than 4 weeks. °· You lose weight without trying. °Get help right away if: °· You cannot control when you pee (urinate) or poop (have a bowel movement). °· You have weakness in any of these areas and it gets worse: °? Lower back. °? The area between your hip bones. °? Butt. °? Legs. °· You have redness or swelling of your back. °· You have a burning feeling when you pee. °Summary °· Sciatica is pain, weakness, tingling, or loss of feeling (numbness) along the sciatic nerve. °· This condition happens when the sciatic nerve is pinched or has pressure put on it. °· Sciatica can cause pain, tingling, or loss of feeling (numbness) in the lower back, legs, hips, and butt. °· Treatment often includes rest, exercise, medicines, and putting ice or heat on the affected area. °This information is not intended to replace advice given to you by your health care provider. Make sure you discuss any questions you have with your health care provider. °Document Released: 03/13/2008 Document Revised: 06/23/2018 Document Reviewed: 06/23/2018 °Elsevier Patient Education © 2020 Else[employer]  Inc. ° °

## 2019-03-18 ENCOUNTER — Ambulatory Visit (INDEPENDENT_AMBULATORY_CARE_PROVIDER_SITE_OTHER): Payer: Medicare Other | Admitting: Family

## 2019-03-18 ENCOUNTER — Other Ambulatory Visit: Payer: Self-pay

## 2019-03-18 ENCOUNTER — Encounter: Payer: Self-pay | Admitting: Family

## 2019-03-18 VITALS — BP 150/82 | HR 74 | Temp 97.5°F | Resp 18 | Ht 63.0 in | Wt 124.4 lb

## 2019-03-18 DIAGNOSIS — Z23 Encounter for immunization: Secondary | ICD-10-CM | POA: Diagnosis not present

## 2019-03-18 DIAGNOSIS — M5431 Sciatica, right side: Secondary | ICD-10-CM

## 2019-03-18 MED ORDER — PREDNISONE 20 MG PO TABS: ORAL_TABLET | ORAL | 0 refills | Status: AC

## 2019-03-18 MED ORDER — KETOROLAC TROMETHAMINE 30 MG/ML IJ SOLN
30.0000 mg | Freq: Once | INTRAMUSCULAR | Status: AC
  Administered 2019-03-18: 30 mg via INTRAMUSCULAR

## 2019-03-18 MED ORDER — TETANUS-DIPHTH-ACELL PERTUSSIS 5-2.5-18.5 LF-MCG/0.5 IM SUSP: 0.5000 mL | Freq: Once | INTRAMUSCULAR | 0 refills | Status: AC

## 2019-03-18 NOTE — Progress Notes (Signed)
Provider: Linah Klapper FNP-C  Sharon SellerEubanks, Jessica K, NP  Patient Care Team: Sharon SellerEubanks, Jessica K, NP as PCP - General (Geriatric Medicine) Julio SicksPool, Henry, MD as Consulting Physician (Neurosurgery)  Extended Emergency Contact Information Primary Emergency Contact: Consuelo PandyBrinkley,[name] Ciano Address: [address]          [place] [id number] [place] Phone: [phone] Relation: Daughter Secondary Emergency Contact: Sebastian AcheMuhammad,Yusef  [place] Home Phone: [phone] Relation: Son  Code Status:  Goals of care: Advanced Directive information Advanced Directives 01/06/2019  Does Patient Have a Medical Advance Directive? Yes  Type of Advance Directive Healthcare Power of Attorney  Does patient want to make changes to medical advance directive? No - Patient declined  Copy of Healthcare Power of Attorney in Chart? Yes - validated most recent copy scanned in chart (See row information)  Would patient like information on creating a medical advance directive? -     Chief Complaint  Patient presents with  . Acute Visit    (R) hip pain radiating down the leg since last Friday.    HPI:  Pt is a 85 y.o. female seen today for an acute visit for evaluation of (R) hip pain radiating down the leg since last Friday.Has taken tylenol without any relief.she states pain starts from her buttock, runs down her right thigh unable to sit.she has had some numbness and tingling described as " ants crawling".she denies any weakness to right leg or injuries.    Past Medical History:  Diagnosis Date  . Arthritis   . DDD (degenerative disc disease), lumbosacral    with chronic back pain and radiculopathy with abnormal MRI  . Diabetes mellitus without complication ([facility])   . Hypertension    Past Surgical History:  Procedure Laterality Date  . APPENDECTOMY    . HYSTEROTOMY      No Known Allergies  Outpatient Encounter Medications as of 03/18/2019  Medication Sig   . acetaminophen (TYLENOL) 500 MG tablet Take 2 tablets (1,000 mg total) by mouth every 8 (eight) hours as needed.  . diclofenac sodium (VOLTAREN) 1 % GEL Apply 2 g topically 4 (four) times daily.  Marland Kitchen. lisinopril (PRINIVIL,ZESTRIL) 5 MG tablet Take 1 tablet (5 mg total) by mouth daily.  . rosuvastatin (CRESTOR) 5 MG tablet Take 1 tablet (5 mg total) by mouth daily.  . Tdap (BOOSTRIX) 5-2.5-18.5 LF-MCG/0.5 injection Inject 0.5 mLs into the muscle once for 1 dose.  . [DISCONTINUED] Tdap (BOOSTRIX) 5-2.5-18.5 LF-MCG/0.5 injection Inject 0.5 mLs into the muscle once.  . [DISCONTINUED] predniSONE (DELTASONE) 20 MG tablet Prednisone 20 mg tablet Take 2 tablets by mouth  daily x 3 day then  Take 20 mg tablet one by mouth daily x 3 days then  Take 10 mg tablet ( 1/2 Tablet) by mouth daily x 3 days then  Take 5 mg tablet  By mouth daily x 3 days and stop.   No facility-administered encounter medications on file as of 03/18/2019.     Review of Systems  Constitutional: Negative for appetite change, chills, fatigue and fever.  Respiratory: Negative for cough, chest tightness, shortness of breath and wheezing.   Cardiovascular: Negative for chest pain, palpitations and leg swelling.  Gastrointestinal: Negative for abdominal distention, abdominal pain, constipation, diarrhea, nausea and vomiting.  Genitourinary: Negative for difficulty urinating, dysuria, flank pain, frequency and urgency.  Musculoskeletal: Positive for arthralgias and gait problem.       Right hip pain radiating down to thigh/leg   Skin:  Negative for color change, pallor and rash.  Neurological: Positive for numbness. Negative for dizziness, weakness and headaches.    Immunization History  Administered Date(s) Administered  . Influenza, High Dose Seasonal PF 03/05/2017, 04/09/2018  . Influenza,inj,Quad PF,6+ Mos 03/27/2016  . Pneumococcal Conjugate-13 12/18/2017   Pertinent  Health Maintenance Due  Topic Date Due  . OPHTHALMOLOGY  EXAM  04/09/1936  . DEXA SCAN  04/09/1993  . PNA vac Low Risk Adult (2 of 2 - PPSV23) 12/19/2018  . HEMOGLOBIN A1C  07/15/2019  . FOOT EXAM  01/06/2020  . INFLUENZA VACCINE  Completed   Fall Risk  03/18/2019 01/06/2019 11/18/2018 10/02/2018 05/22/2018  Falls in the past year? 0 0 0 0 0  Number falls in past yr: 0 0 0 0 0  Comment - - - - -  Injury with Fall? - 0 0 0 0    Vitals:   03/18/19 0922  BP: (!) 150/82  Pulse: 74  Resp: 18  Temp: (!) 97.5 F (36.4 C)  SpO2: 96%  Weight: 124 lb 6.4 oz (56.4 kg)  Height: 5\' 3"  (1.6 m)   Body mass index is 22.04 kg/m. Physical Exam Constitutional:      General: She is not in acute distress.    Appearance: She is normal weight. She is not ill-appearing.     Comments: In pain unable to sit on right buttock due to pain   HENT:     Head: Normocephalic.     Mouth/Throat:     Mouth: Mucous membranes are moist.     Pharynx: Oropharynx is clear. No oropharyngeal exudate or posterior oropharyngeal erythema.  Eyes:     General: No scleral icterus.       Right eye: No discharge.        Left eye: No discharge.     Conjunctiva/sclera: Conjunctivae normal.     Pupils: Pupils are equal, round, and reactive to light.  Cardiovascular:     Rate and Rhythm: Normal rate and regular rhythm.     Pulses: Normal pulses.     Heart sounds: Normal heart sounds. No murmur. No friction rub. No gallop.   Pulmonary:     Effort: Pulmonary effort is normal. No respiratory distress.     Breath sounds: Normal breath sounds. No wheezing, rhonchi or rales.  Chest:     Chest wall: No tenderness.  Abdominal:     General: Bowel sounds are normal. There is no distension.     Palpations: Abdomen is soft. There is no mass.     Tenderness: There is no abdominal tenderness. There is no right CVA tenderness, left CVA tenderness, guarding or rebound.  Musculoskeletal:        General: No swelling or tenderness.     Right lower leg: No edema.     Left lower leg: No edema.      Comments: Unsteady gait.Negative straight leg raise   Skin:    General: Skin is warm and dry.     Coloration: Skin is not pale.     Findings: No bruising or erythema.  Neurological:     Mental Status: She is alert. Mental status is at baseline.     Cranial Nerves: No cranial nerve deficit.     Sensory: No sensory deficit.     Motor: No weakness.     Coordination: Coordination normal.     Gait: Gait abnormal.  Psychiatric:        Mood and Affect: Mood is not  depressed. Affect is tearful.        Speech: Speech normal.        Behavior: Behavior normal.        Thought Content: Thought content normal.        Judgment: Judgment normal.     Labs reviewed: Recent Labs    05/22/18 1120 10/09/18 0804 01/12/19 0852  NA 143 142 144  K 4.5 4.6 4.5  CL 106 105 108  CO2 31 28 26   GLUCOSE 110* 112* 96  BUN 11 25 18   CREATININE 1.14* 1.15* 1.31*  CALCIUM 9.9 9.7 9.4   Recent Labs    05/22/18 1120 10/09/18 0804 01/12/19 0852  AST 20 19 22   ALT 13 16 15   BILITOT 0.8 0.8 0.7  PROT 7.0 6.9 6.7   Recent Labs    05/22/18 1120 01/12/19 0852  WBC 3.8 4.4  NEUTROABS 2,136 2,160  HGB 14.2 13.0  HCT 43.6 39.1  MCV 85.0 86.7  PLT 204 168   Lab Results  Component Value Date   TSH 1.033 11/23/2016   Lab Results  Component Value Date   HGBA1C 6.2 (H) 01/12/2019   Lab Results  Component Value Date   CHOL 193 10/09/2018   HDL 72 10/09/2018   LDLCALC 100 (H) 10/09/2018   TRIG 112 10/09/2018   CHOLHDL 2.7 10/09/2018    Significant Diagnostic Results in last 30 days:  No results found.  Assessment/Plan 1. Need for tetanus booster Due for Tdap.order send to her Pharmacy. - Tdap (BOOSTRIX) 5-2.5-18.5 LF-MCG/0.5 injection; Inject 0.5 mLs into the muscle once for 1 dose.  Dispense: 0.5 mL; Refill: 0  2. Need for 23-polyvalent pneumococcal polysaccharide vaccine Afebrile.  - Pneumococcal polysaccharide vaccine 23-valent greater than or equal to 2yo subcutaneous/IM given  by Rice sharon CMA   3. Right sciatic nerve pain Pain runs down from right gluteal to the back of the thigh and down to the leg.Negative SLR.No weakness noted.  Tordal 30 mg I.m injection given for pain. Prednisone 20 mg tablet tapered dose ordered.Instructed to notify provider if symptoms worsen or resolve or go to the ED.   Family/ staff Communication: Reviewed plan of care with patient.  Labs/tests ordered: None   Ardean Simonich C Bijan Ridgley, NP

## 2019-03-18 NOTE — Patient Instructions (Signed)
Sciatica ° °Sciatica is pain, weakness, tingling, or loss of feeling (numbness) along the sciatic nerve. The sciatic nerve starts in the lower back and goes down the back of each leg. Sciatica usually goes away on its own or with treatment. Sometimes, sciatica may come back (recur). °What are the causes? °This condition happens when the sciatic nerve is pinched or has pressure put on it. This may be the result of: °· A disk in between the bones of the spine bulging out too far (herniated disk). °· Changes in the spinal disks that occur with aging. °· A condition that affects a muscle in the butt. °· Extra bone growth near the sciatic nerve. °· A break (fracture) of the area between your hip bones (pelvis). °· Pregnancy. °· Tumor. This is rare. °What increases the risk? °You are more likely to develop this condition if you: °· Play sports that put pressure or stress on the spine. °· Have poor strength and ease of movement (flexibility). °· Have had a back injury in the past. °· Have had back surgery. °· Sit for long periods of time. °· Do activities that involve bending or lifting over and over again. °· Are very overweight (obese). °What are the signs or symptoms? °Symptoms can vary from mild to very bad. They may include: °· Any of these problems in the lower back, leg, hip, or butt: °? Mild tingling, loss of feeling, or dull aches. °? Burning sensations. °? Sharp pains. °· Loss of feeling in the back of the calf or the sole of the foot. °· Leg weakness. °· Very bad back pain that makes it hard to move. °These symptoms may get worse when you cough, sneeze, or laugh. They may also get worse when you sit or stand for long periods of time. °How is this treated? °This condition often gets better without any treatment. However, treatment may include: °· Changing or cutting back on physical activity when you have pain. °· Doing exercises and stretching. °· Putting ice or heat on the affected area. °· Medicines that  help: °? To relieve pain and swelling. °? To relax your muscles. °· Shots (injections) of medicines that help to relieve pain, irritation, and swelling. °· Surgery. °Follow these instructions at home: °Medicines °· Take over-the-counter and prescription medicines only as told by your doctor. °· Ask your doctor if the medicine prescribed to you: °? Requires you to avoid driving or using heavy machinery. °? Can cause trouble pooping (constipation). You may need to take these steps to prevent or treat trouble pooping: °§ Drink enough fluids to keep your pee (urine) pale yellow. °§ Take over-the-counter or prescription medicines. °§ Eat foods that are high in fiber. These include beans, whole grains, and fresh fruits and vegetables. °§ Limit foods that are high in fat and sugar. These include fried or sweet foods. °Managing pain ° °  ° °· If told, put ice on the affected area. °? Put ice in a plastic bag. °? Place a towel between your skin and the bag. °? Leave the ice on for 20 minutes, 2-3 times a day. °· If told, put heat on the affected area. Use the heat source that your doctor tells you to use, such as a moist heat pack or a heating pad. °? Place a towel between your skin and the heat source. °? Leave the heat on for 20-30 minutes. °? Remove the heat if your skin turns bright red. This is very important if you are   unable to feel pain, heat, or cold. You may have a greater risk of getting burned. °Activity ° °· Return to your normal activities as told by your doctor. Ask your doctor what activities are safe for you. °· Avoid activities that make your symptoms worse. °· Take short rests during the day. °? When you rest for a long time, do some physical activity or stretching between periods of rest. °? Avoid sitting for a long time without moving. Get up and move around at least one time each hour. °· Exercise and stretch regularly, as told by your doctor. °· Do not lift anything that is heavier than 10 lb (4.5 kg)  while you have symptoms of sciatica. °? Avoid lifting heavy things even when you do not have symptoms. °? Avoid lifting heavy things over and over. °· When you lift objects, always lift in a way that is safe for your body. To do this, you should: °? Bend your knees. °? Keep the object close to your body. °? Avoid twisting. °General instructions °· Stay at a healthy weight. °· Wear comfortable shoes that support your feet. Avoid wearing high heels. °· Avoid sleeping on a mattress that is too soft or too hard. You might have less pain if you sleep on a mattress that is firm enough to support your back. °· Keep all follow-up visits as told by your doctor. This is important. °Contact a doctor if: °· You have pain that: °? Wakes you up when you are sleeping. °? Gets worse when you lie down. °? Is worse than the pain you have had in the past. °? Lasts longer than 4 weeks. °· You lose weight without trying. °Get help right away if: °· You cannot control when you pee (urinate) or poop (have a bowel movement). °· You have weakness in any of these areas and it gets worse: °? Lower back. °? The area between your hip bones. °? Butt. °? Legs. °· You have redness or swelling of your back. °· You have a burning feeling when you pee. °Summary °· Sciatica is pain, weakness, tingling, or loss of feeling (numbness) along the sciatic nerve. °· This condition happens when the sciatic nerve is pinched or has pressure put on it. °· Sciatica can cause pain, tingling, or loss of feeling (numbness) in the lower back, legs, hips, and butt. °· Treatment often includes rest, exercise, medicines, and putting ice or heat on the affected area. °This information is not intended to replace advice given to you by your health care provider. Make sure you discuss any questions you have with your health care provider. °Document Released: 03/13/2008 Document Revised: 06/23/2018 Document Reviewed: 06/23/2018 °Elsevier Patient Education © 2020 Else[employer]  Inc. ° °

## 2019-03-20 MED ORDER — KETOROLAC TROMETHAMINE 30 MG/ML IJ SOLN: 30.0000 mg | Freq: Once | INTRAMUSCULAR | Status: DC

## 2019-03-20 NOTE — Addendum Note (Signed)
Addended by: Eilene Ghazi on: 03/20/2019 04:32 PM   Modules accepted: Orders

## 2019-03-20 NOTE — Addendum Note (Signed)
Addended by: Eilene Ghazi on: 03/20/2019 12:44 PM   Modules accepted: Orders

## 2019-04-08 ENCOUNTER — Ambulatory Visit: Payer: Medicare Other | Admitting: Nurse Practitioner

## 2019-04-17 ENCOUNTER — Ambulatory Visit: Payer: Medicare Other | Admitting: Nurse Practitioner

## 2019-05-01 ENCOUNTER — Ambulatory Visit: Payer: Medicare Other | Admitting: Nurse Practitioner

## 2019-07-22 ENCOUNTER — Encounter: Payer: Medicare Other | Admitting: Nurse Practitioner

## 2019-07-22 ENCOUNTER — Encounter: Payer: Self-pay | Admitting: Nurse Practitioner

## 2019-07-22 ENCOUNTER — Other Ambulatory Visit: Payer: Self-pay

## 2019-07-22 NOTE — Progress Notes (Signed)
Patient has permanently moved to [place] with her daughter and is under the care of a new primary care provider

## 2019-08-05 DIAGNOSIS — E1151 Type 2 diabetes mellitus with diabetic peripheral angiopathy without gangrene: Secondary | ICD-10-CM | POA: Diagnosis not present

## 2019-08-05 DIAGNOSIS — E782 Mixed hyperlipidemia: Secondary | ICD-10-CM | POA: Diagnosis not present

## 2019-08-05 DIAGNOSIS — E538 Deficiency of other specified B group vitamins: Secondary | ICD-10-CM | POA: Diagnosis not present

## 2019-08-05 DIAGNOSIS — R5383 Other fatigue: Secondary | ICD-10-CM | POA: Diagnosis not present

## 2019-08-05 DIAGNOSIS — R4189 Other symptoms and signs involving cognitive functions and awareness: Secondary | ICD-10-CM | POA: Diagnosis not present

## 2019-08-05 DIAGNOSIS — I10 Essential (primary) hypertension: Secondary | ICD-10-CM | POA: Diagnosis not present

## 2019-08-05 DIAGNOSIS — R634 Abnormal weight loss: Secondary | ICD-10-CM | POA: Diagnosis not present

## 2019-08-05 DIAGNOSIS — E559 Vitamin D deficiency, unspecified: Secondary | ICD-10-CM | POA: Diagnosis not present

## 2019-08-05 DIAGNOSIS — M130 Polyarthritis, unspecified: Secondary | ICD-10-CM | POA: Diagnosis not present

## 2019-08-12 DIAGNOSIS — E782 Mixed hyperlipidemia: Secondary | ICD-10-CM | POA: Diagnosis not present

## 2019-08-12 DIAGNOSIS — I10 Essential (primary) hypertension: Secondary | ICD-10-CM | POA: Diagnosis not present

## 2019-08-12 DIAGNOSIS — F329 Major depressive disorder, single episode, unspecified: Secondary | ICD-10-CM | POA: Diagnosis not present

## 2019-08-12 DIAGNOSIS — E1151 Type 2 diabetes mellitus with diabetic peripheral angiopathy without gangrene: Secondary | ICD-10-CM | POA: Diagnosis not present

## 2019-08-27 DIAGNOSIS — H26491 Other secondary cataract, right eye: Secondary | ICD-10-CM | POA: Diagnosis not present

## 2019-10-04 DIAGNOSIS — R52 Pain, unspecified: Secondary | ICD-10-CM | POA: Diagnosis not present

## 2019-10-04 DIAGNOSIS — R0789 Other chest pain: Secondary | ICD-10-CM | POA: Diagnosis not present

## 2019-10-04 DIAGNOSIS — Z043 Encounter for examination and observation following other accident: Secondary | ICD-10-CM | POA: Diagnosis not present

## 2019-10-04 DIAGNOSIS — M25551 Pain in right hip: Secondary | ICD-10-CM | POA: Diagnosis not present

## 2019-10-04 DIAGNOSIS — R0781 Pleurodynia: Secondary | ICD-10-CM | POA: Diagnosis not present

## 2019-10-04 DIAGNOSIS — M25511 Pain in right shoulder: Secondary | ICD-10-CM | POA: Diagnosis not present

## 2019-10-09 DIAGNOSIS — E878 Other disorders of electrolyte and fluid balance, not elsewhere classified: Secondary | ICD-10-CM | POA: Diagnosis not present

## 2019-10-09 DIAGNOSIS — I10 Essential (primary) hypertension: Secondary | ICD-10-CM | POA: Diagnosis not present

## 2019-10-09 DIAGNOSIS — E1151 Type 2 diabetes mellitus with diabetic peripheral angiopathy without gangrene: Secondary | ICD-10-CM | POA: Diagnosis not present

## 2019-10-09 DIAGNOSIS — M8949 Other hypertrophic osteoarthropathy, multiple sites: Secondary | ICD-10-CM | POA: Diagnosis not present

## 2019-10-31 ENCOUNTER — Other Ambulatory Visit: Payer: Self-pay | Admitting: Nurse Practitioner

## 2019-10-31 DIAGNOSIS — E782 Mixed hyperlipidemia: Secondary | ICD-10-CM

## 2019-11-06 DIAGNOSIS — Z20822 Contact with and (suspected) exposure to covid-19: Secondary | ICD-10-CM | POA: Diagnosis not present

## 2019-11-06 DIAGNOSIS — Z23 Encounter for immunization: Secondary | ICD-10-CM | POA: Diagnosis not present

## 2019-11-20 ENCOUNTER — Encounter: Payer: Medicare Other | Admitting: Family

## 2019-11-26 ENCOUNTER — Encounter: Payer: Medicare Other | Admitting: Family

## 2019-11-26 ENCOUNTER — Other Ambulatory Visit: Payer: Self-pay

## 2019-11-28 DIAGNOSIS — Z23 Encounter for immunization: Secondary | ICD-10-CM | POA: Diagnosis not present

## 2019-11-28 DIAGNOSIS — Z20822 Contact with and (suspected) exposure to covid-19: Secondary | ICD-10-CM | POA: Diagnosis not present

## 2020-01-07 DIAGNOSIS — E1151 Type 2 diabetes mellitus with diabetic peripheral angiopathy without gangrene: Secondary | ICD-10-CM | POA: Diagnosis not present

## 2020-01-07 DIAGNOSIS — E871 Hypo-osmolality and hyponatremia: Secondary | ICD-10-CM | POA: Diagnosis not present

## 2020-01-07 DIAGNOSIS — M48061 Spinal stenosis, lumbar region without neurogenic claudication: Secondary | ICD-10-CM | POA: Diagnosis not present

## 2020-01-07 DIAGNOSIS — R4189 Other symptoms and signs involving cognitive functions and awareness: Secondary | ICD-10-CM | POA: Diagnosis not present
